# Patient Record
Sex: Female | Born: 1990 | Hispanic: No | Marital: Single | Smoking: Never smoker
Health system: Southern US, Community
[De-identification: ages and names within clinical notes are randomized; demographics above are authoritative.]

## PROBLEM LIST (undated history)

## (undated) ENCOUNTER — Inpatient Hospital Stay (HOSPITAL_COMMUNITY): Payer: Self-pay

## (undated) DIAGNOSIS — Z87440 Personal history of urinary (tract) infections: Secondary | ICD-10-CM

## (undated) DIAGNOSIS — B1910 Unspecified viral hepatitis B without hepatic coma: Secondary | ICD-10-CM

## (undated) DIAGNOSIS — K759 Inflammatory liver disease, unspecified: Secondary | ICD-10-CM

## (undated) HISTORY — PX: NO PAST SURGERIES: SHX2092

## (undated) HISTORY — DX: Unspecified viral hepatitis B without hepatic coma: B19.10

---

## 2013-10-26 ENCOUNTER — Emergency Department (HOSPITAL_COMMUNITY): Payer: No Typology Code available for payment source

## 2013-10-26 ENCOUNTER — Encounter (HOSPITAL_COMMUNITY): Payer: Self-pay | Admitting: Emergency Medicine

## 2013-10-26 ENCOUNTER — Emergency Department (HOSPITAL_COMMUNITY)
Admission: EM | Admit: 2013-10-26 | Discharge: 2013-10-26 | Disposition: A | Discharge status: Home / Self Care | Payer: No Typology Code available for payment source | Attending: Emergency Medicine | Admitting: Emergency Medicine

## 2013-10-26 DIAGNOSIS — R079 Chest pain, unspecified: Secondary | ICD-10-CM | POA: Insufficient documentation

## 2013-10-26 DIAGNOSIS — Y92410 Unspecified street and highway as the place of occurrence of the external cause: Secondary | ICD-10-CM | POA: Insufficient documentation

## 2013-10-26 DIAGNOSIS — R0602 Shortness of breath: Secondary | ICD-10-CM | POA: Insufficient documentation

## 2013-10-26 DIAGNOSIS — M549 Dorsalgia, unspecified: Secondary | ICD-10-CM | POA: Insufficient documentation

## 2013-10-26 DIAGNOSIS — Y939 Activity, unspecified: Secondary | ICD-10-CM | POA: Insufficient documentation

## 2013-10-26 DIAGNOSIS — S161XXA Strain of muscle, fascia and tendon at neck level, initial encounter: Secondary | ICD-10-CM

## 2013-10-26 DIAGNOSIS — S139XXA Sprain of joints and ligaments of unspecified parts of neck, initial encounter: Secondary | ICD-10-CM | POA: Insufficient documentation

## 2013-10-26 DIAGNOSIS — J029 Acute pharyngitis, unspecified: Secondary | ICD-10-CM | POA: Insufficient documentation

## 2013-10-26 MED ORDER — HYDROCODONE-ACETAMINOPHEN 5-325 MG PO TABS: 1.0000 | ORAL_TABLET | Freq: Four times a day (QID) | ORAL | Status: DC | PRN

## 2013-10-26 MED ORDER — IBUPROFEN 800 MG PO TABS: 800.0000 mg | ORAL_TABLET | Freq: Three times a day (TID) | ORAL | Status: DC | PRN

## 2013-10-26 NOTE — ED Notes (Addendum)
Pt speaks broken [place]. Family informed RN patient speaks Clydie BraunKaren.

## 2013-10-26 NOTE — ED Notes (Signed)
Patient transported to X-ray 
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
## 2013-10-26 NOTE — Discharge Instructions (Signed)
Return here as needed.  Your x-rays were negative.  Will be sore over the next 7-10 days, and more sore later today and tomorrow use ice and heat on areas that are sore
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
## 2013-10-26 NOTE — ED Provider Notes (Signed)
Medical screening examination/treatment/procedure(s) were performed by non-physician practitioner and as supervising physician I was immediately available for consultation/collaboration.  EKG Interpretation   None         William Audiel Scheiber, MD 10/26/13 1159 

## 2013-10-26 NOTE — ED Notes (Signed)
Pt returned from X-ray.  

## 2014-06-27 ENCOUNTER — Encounter: Payer: Self-pay | Admitting: Obstetrics

## 2014-06-27 ENCOUNTER — Other Ambulatory Visit: Payer: Self-pay | Admitting: Obstetrics

## 2014-06-27 ENCOUNTER — Ambulatory Visit (INDEPENDENT_AMBULATORY_CARE_PROVIDER_SITE_OTHER): Payer: Medicaid Other | Admitting: Obstetrics

## 2014-06-27 VITALS — BP 109/77 | HR 85 | Temp 97.1°F | Ht 65.0 in | Wt 144.0 lb

## 2014-06-27 DIAGNOSIS — N390 Urinary tract infection, site not specified: Secondary | ICD-10-CM

## 2014-06-27 DIAGNOSIS — Z3401 Encounter for supervision of normal first pregnancy, first trimester: Secondary | ICD-10-CM

## 2014-06-27 DIAGNOSIS — Z3402 Encounter for supervision of normal first pregnancy, second trimester: Secondary | ICD-10-CM

## 2014-06-27 DIAGNOSIS — Z23 Encounter for immunization: Secondary | ICD-10-CM

## 2014-06-27 DIAGNOSIS — Z3400 Encounter for supervision of normal first pregnancy, unspecified trimester: Secondary | ICD-10-CM

## 2014-06-27 DIAGNOSIS — O9989 Other specified diseases and conditions complicating pregnancy, childbirth and the puerperium: Secondary | ICD-10-CM

## 2014-06-27 LAB — POCT URINALYSIS DIPSTICK
Bilirubin, UA: NEGATIVE
Glucose, UA: NEGATIVE
Ketones, UA: NEGATIVE
Nitrite, UA: POSITIVE
PH UA: 7
Spec Grav, UA: 1.01
Urobilinogen, UA: NEGATIVE

## 2014-06-27 LAB — OB RESULTS CONSOLE GC/CHLAMYDIA
CHLAMYDIA, DNA PROBE: NEGATIVE
GC PROBE AMP, GENITAL: NEGATIVE

## 2014-06-27 LAB — TSH: TSH: 2.583 u[IU]/mL (ref 0.350–4.500)

## 2014-06-27 MED ORDER — NITROFURANTOIN MONOHYD MACRO 100 MG PO CAPS: 100.0000 mg | ORAL_CAPSULE | Freq: Two times a day (BID) | ORAL | Status: DC

## 2014-06-27 MED ORDER — INFLUENZA VAC SPLIT QUAD 0.5 ML IM SUSY: 0.5000 mL | PREFILLED_SYRINGE | Freq: Once | INTRAMUSCULAR | Status: DC

## 2014-06-27 MED ORDER — OB COMPLETE PETITE 35-5-1-200 MG PO CAPS: 1.0000 | ORAL_CAPSULE | Freq: Every day | ORAL | Status: DC

## 2014-06-28 ENCOUNTER — Other Ambulatory Visit: Payer: Self-pay | Admitting: Obstetrics

## 2014-06-28 DIAGNOSIS — B9689 Other specified bacterial agents as the cause of diseases classified elsewhere: Secondary | ICD-10-CM

## 2014-06-28 DIAGNOSIS — N760 Acute vaginitis: Principal | ICD-10-CM

## 2014-06-28 LAB — WET PREP BY MOLECULAR PROBE
Candida species: NEGATIVE
GARDNERELLA VAGINALIS: POSITIVE — AB
Trichomonas vaginosis: NEGATIVE

## 2014-06-28 LAB — OBSTETRIC PANEL
ANTIBODY SCREEN: NEGATIVE
BASOS ABS: 0 10*3/uL (ref 0.0–0.1)
BASOS PCT: 0 % (ref 0–1)
EOS ABS: 0.1 10*3/uL (ref 0.0–0.7)
EOS PCT: 1 % (ref 0–5)
HEMATOCRIT: 34.1 % — AB (ref 36.0–46.0)
Hemoglobin: 11.4 g/dL — ABNORMAL LOW (ref 12.0–15.0)
Hepatitis B Surface Ag: POSITIVE — AB
Lymphocytes Relative: 22 % (ref 12–46)
Lymphs Abs: 1.7 10*3/uL (ref 0.7–4.0)
MCH: 29.6 pg (ref 26.0–34.0)
MCHC: 33.4 g/dL (ref 30.0–36.0)
MCV: 88.6 fL (ref 78.0–100.0)
MONO ABS: 0.5 10*3/uL (ref 0.1–1.0)
Monocytes Relative: 6 % (ref 3–12)
Neutro Abs: 5.5 10*3/uL (ref 1.7–7.7)
Neutrophils Relative %: 71 % (ref 43–77)
Platelets: 334 10*3/uL (ref 150–400)
RBC: 3.85 MIL/uL — ABNORMAL LOW (ref 3.87–5.11)
RDW: 14.9 % (ref 11.5–15.5)
Rh Type: POSITIVE
Rubella: 8.74 Index — ABNORMAL HIGH (ref <0.90)
WBC: 7.8 10*3/uL (ref 4.0–10.5)

## 2014-06-28 LAB — PAP IG W/ RFLX HPV ASCU

## 2014-06-28 LAB — VITAMIN D 25 HYDROXY (VIT D DEFICIENCY, FRACTURES): Vit D, 25-Hydroxy: 38 ng/mL (ref 30–89)

## 2014-06-28 LAB — GC/CHLAMYDIA PROBE AMP
CT PROBE, AMP APTIMA: NEGATIVE
GC Probe RNA: NEGATIVE

## 2014-06-28 LAB — VARICELLA ZOSTER ANTIBODY, IGG: Varicella IgG: 2218 Index — ABNORMAL HIGH (ref <135.00)

## 2014-06-28 LAB — HIV ANTIBODY (ROUTINE TESTING W REFLEX): HIV 1&2 Ab, 4th Generation: NONREACTIVE

## 2014-06-28 LAB — HEPATITIS B SURF AG CONFIRMATION: HEPATITIS B SURFACE ANTIGEN CONFIRMATION: POSITIVE — AB

## 2014-06-28 MED ORDER — METRONIDAZOLE 500 MG PO TABS: 500.0000 mg | ORAL_TABLET | Freq: Two times a day (BID) | ORAL | Status: DC

## 2014-06-29 ENCOUNTER — Encounter: Payer: Self-pay | Admitting: Obstetrics

## 2014-06-29 DIAGNOSIS — N390 Urinary tract infection, site not specified: Secondary | ICD-10-CM | POA: Insufficient documentation

## 2014-06-29 NOTE — Progress Notes (Signed)
Subjective:    Joan Sawyer is a 23 y.o. female being seen today for her obstetrical visit. She is at Unknown gestation. Patient reports: no complaints . Fetal movement: normal.  Problem List Items Addressed This Visit   None    Visit Diagnoses   Encounter for supervision of normal first pregnancy in second trimester    -  Primary    Relevant Medications       Prenat-FeCbn-FeAspGl-FA-Omega (OB COMPLETE PETITE) 35-5-1-200 MG CAPS    Encounter for supervision of normal first pregnancy in first trimester        Relevant Orders       POCT urinalysis dipstick (Completed)       POCT urinalysis dipstick       Obstetric panel       HIV antibody       Hemoglobinopathy evaluation       Varicella zoster antibody, IgG       Vit D  25 hydroxy (rtn osteoporosis monitoring)       Culture, OB Urine       TSH       WET PREP BY MOLECULAR PROBE (Completed)       GC/Chlamydia Probe Amp       Pap IG w/ reflex to HPV when ASC-U (Completed)    Urinary tract infection, site not specified        Relevant Medications       Nitrofurantoin monohyd macro (MACROBID) 100 mg po cap       Influenza vac split quadrivalent PF (FLUARIX) injection 0.5 mL    Other specified complication, antepartum(646.83)        Relevant Orders       US OB Comp + 14 Wk      There are no active problems to display for this patient.  Objective:    BP 109/77  Pulse 85  Temp(Src) 97.1 F (36.2 C)  Ht  (1.651 m)  Wt 144 lb (65.318 kg)  BMI 23.96 kg/m2  LMP 01/08/2014 FHT: 150 BPM  Uterine Size: size equals dates     Assessment:    Pregnancy @ Unknown    Plan:    OBGCT: ordered.  Labs, problem list reviewed and updated 2 hr GTT planned Follow up in 2 weeks.

## 2014-06-30 ENCOUNTER — Other Ambulatory Visit: Payer: Self-pay | Admitting: Obstetrics

## 2014-06-30 DIAGNOSIS — R768 Other specified abnormal immunological findings in serum: Secondary | ICD-10-CM

## 2014-06-30 LAB — CULTURE, OB URINE: Colony Count: >=100000

## 2014-06-30 LAB — HEMOGLOBINOPATHY EVALUATION
HGB A2 QUANT: 3 % (ref 2.2–3.2)
HGB F QUANT: 0.4 % (ref 0.0–2.0)
Hemoglobin Other: 0 %
Hgb A: 96.6 % — ABNORMAL LOW (ref 96.8–97.8)
Hgb S Quant: 0 %

## 2014-07-01 ENCOUNTER — Ambulatory Visit (HOSPITAL_COMMUNITY)
Admission: RE | Admit: 2014-07-01 | Discharge: 2014-07-01 | Disposition: A | Discharge status: Home / Self Care | Payer: Medicaid Other | Source: Ambulatory Visit | Attending: Obstetrics | Admitting: Obstetrics

## 2014-07-01 DIAGNOSIS — O9989 Other specified diseases and conditions complicating pregnancy, childbirth and the puerperium: Secondary | ICD-10-CM

## 2014-07-01 DIAGNOSIS — O0930 Supervision of pregnancy with insufficient antenatal care, unspecified trimester: Secondary | ICD-10-CM | POA: Insufficient documentation

## 2014-07-01 DIAGNOSIS — Z3689 Encounter for other specified antenatal screening: Secondary | ICD-10-CM

## 2014-07-01 DIAGNOSIS — O0932 Supervision of pregnancy with insufficient antenatal care, second trimester: Secondary | ICD-10-CM

## 2014-07-01 NOTE — Progress Notes (Signed)
Patient does not speak English, so appointment to review lab with interpeter has been scheduled.

## 2014-07-02 ENCOUNTER — Other Ambulatory Visit: Payer: Self-pay | Admitting: Obstetrics

## 2014-07-04 ENCOUNTER — Encounter: Payer: Medicaid Other | Admitting: Obstetrics

## 2014-07-05 ENCOUNTER — Ambulatory Visit (INDEPENDENT_AMBULATORY_CARE_PROVIDER_SITE_OTHER): Payer: Medicaid Other | Admitting: Obstetrics

## 2014-07-05 VITALS — BP 115/78 | HR 82 | Wt 148.0 lb

## 2014-07-05 DIAGNOSIS — R768 Other specified abnormal immunological findings in serum: Secondary | ICD-10-CM | POA: Insufficient documentation

## 2014-07-05 DIAGNOSIS — R894 Abnormal immunological findings in specimens from other organs, systems and tissues: Secondary | ICD-10-CM

## 2014-07-05 DIAGNOSIS — Z3402 Encounter for supervision of normal first pregnancy, second trimester: Secondary | ICD-10-CM

## 2014-07-05 DIAGNOSIS — Z3401 Encounter for supervision of normal first pregnancy, first trimester: Secondary | ICD-10-CM

## 2014-07-05 DIAGNOSIS — Z3400 Encounter for supervision of normal first pregnancy, unspecified trimester: Secondary | ICD-10-CM

## 2014-07-05 NOTE — Progress Notes (Signed)
Subjective:    Joan Sawyer is a 23 y.o. female being seen today for her obstetrical visit. She is at [redacted] weeks gestation. Patient reports: no complaints . Fetal movement: normal.  Problem List Items Addressed This Visit   None    Visit Diagnoses   Encounter for supervision of normal first pregnancy in first trimester    -  Primary    Relevant Orders       POCT urinalysis dipstick      Patient Active Problem List   Diagnosis Date Noted  . Urinary tract infection, site not specified 06/29/2014   Objective:    BP 115/78  Pulse 82  Wt 148 lb (67.132 kg)  LMP 01/08/2014 FHT: 150 BPM  Uterine Size: size equals dates     Assessment:    Pregnancy @ [redacted] weeks gestation.  Positive Hepatitis B screen   Plan:   Hepatitis panel drawn today. Will need referral to GI.    Labs, problem list reviewed and updated 2 hr GTT planned next visit Follow up in 2 weeks.

## 2014-07-06 ENCOUNTER — Encounter: Payer: Self-pay | Admitting: Obstetrics

## 2014-07-06 ENCOUNTER — Encounter: Payer: Medicaid Other | Admitting: Obstetrics

## 2014-07-06 DIAGNOSIS — Z3402 Encounter for supervision of normal first pregnancy, second trimester: Secondary | ICD-10-CM | POA: Insufficient documentation

## 2014-07-06 LAB — COMPREHENSIVE METABOLIC PANEL
ALT: 58 U/L — AB (ref 0–35)
AST: 38 U/L — AB (ref 0–37)
Albumin: 3.5 g/dL (ref 3.5–5.2)
Alkaline Phosphatase: 67 U/L (ref 39–117)
BILIRUBIN TOTAL: 0.3 mg/dL (ref 0.2–1.2)
BUN: 6 mg/dL (ref 6–23)
CO2: 24 mEq/L (ref 19–32)
Calcium: 8.7 mg/dL (ref 8.4–10.5)
Chloride: 105 mEq/L (ref 96–112)
Creat: 0.5 mg/dL (ref 0.50–1.10)
Glucose, Bld: 61 mg/dL — ABNORMAL LOW (ref 70–99)
Potassium: 4 mEq/L (ref 3.5–5.3)
SODIUM: 136 meq/L (ref 135–145)
TOTAL PROTEIN: 6.5 g/dL (ref 6.0–8.3)

## 2014-07-06 LAB — HEPATITIS PANEL, ACUTE
HCV Ab: NEGATIVE
HEP A IGM: NONREACTIVE
HEP B S AG: POSITIVE — AB
Hep B C IgM: NONREACTIVE

## 2014-07-06 LAB — HEPATITIS B SURF AG CONFIRMATION

## 2014-07-06 LAB — HEPATITIS B SURFACE ANTIBODY,QUALITATIVE: HEP B S AB: POSITIVE — AB

## 2014-07-06 LAB — HEPATITIS C ANTIBODY: HCV AB: NEGATIVE

## 2014-07-06 LAB — HEPATITIS A ANTIBODY, IGM: HEP A IGM: NONREACTIVE

## 2014-07-06 LAB — HEPATITIS B CORE ANTIBODY, IGM: HEP B C IGM: NONREACTIVE

## 2014-07-06 LAB — HEPATITIS B CORE ANTIBODY, TOTAL: HEP B C TOTAL AB: REACTIVE — AB

## 2014-07-06 NOTE — Progress Notes (Signed)
Labs reviewed.

## 2014-07-07 LAB — HEPATITIS B DNA, ULTRAQUANTITATIVE, PCR
Hepatitis B DNA (Calc): 473672707 copies/mL — ABNORMAL HIGH (ref <116)
Hepatitis B DNA: 81387063 IU/mL — ABNORMAL HIGH (ref <20)

## 2014-07-08 ENCOUNTER — Other Ambulatory Visit: Payer: Self-pay | Admitting: Obstetrics

## 2014-07-08 DIAGNOSIS — B1910 Unspecified viral hepatitis B without hepatic coma: Secondary | ICD-10-CM

## 2014-07-08 DIAGNOSIS — O98412 Viral hepatitis complicating pregnancy, second trimester: Principal | ICD-10-CM

## 2014-07-11 ENCOUNTER — Other Ambulatory Visit: Payer: Medicaid Other

## 2014-07-11 ENCOUNTER — Ambulatory Visit (INDEPENDENT_AMBULATORY_CARE_PROVIDER_SITE_OTHER): Payer: Medicaid Other | Admitting: Obstetrics

## 2014-07-11 ENCOUNTER — Encounter: Payer: Self-pay | Admitting: Obstetrics

## 2014-07-11 VITALS — BP 123/82 | HR 92 | Temp 97.1°F | Wt 149.0 lb

## 2014-07-11 DIAGNOSIS — Z3403 Encounter for supervision of normal first pregnancy, third trimester: Secondary | ICD-10-CM

## 2014-07-11 DIAGNOSIS — Z3400 Encounter for supervision of normal first pregnancy, unspecified trimester: Secondary | ICD-10-CM

## 2014-07-11 DIAGNOSIS — R768 Other specified abnormal immunological findings in serum: Secondary | ICD-10-CM

## 2014-07-11 DIAGNOSIS — R894 Abnormal immunological findings in specimens from other organs, systems and tissues: Secondary | ICD-10-CM

## 2014-07-11 DIAGNOSIS — Z3402 Encounter for supervision of normal first pregnancy, second trimester: Secondary | ICD-10-CM

## 2014-07-11 LAB — POCT URINALYSIS DIPSTICK
BILIRUBIN UA: NEGATIVE
GLUCOSE UA: NEGATIVE
KETONES UA: NEGATIVE
Leukocytes, UA: NEGATIVE
Nitrite, UA: NEGATIVE
PROTEIN UA: NEGATIVE
RBC UA: NEGATIVE
SPEC GRAV UA: 1.01
Urobilinogen, UA: NEGATIVE
pH, UA: 6

## 2014-07-11 LAB — CBC
HEMATOCRIT: 34.9 % — AB (ref 36.0–46.0)
HEMOGLOBIN: 11.4 g/dL — AB (ref 12.0–15.0)
MCH: 29.8 pg (ref 26.0–34.0)
MCHC: 32.7 g/dL (ref 30.0–36.0)
MCV: 91.4 fL (ref 78.0–100.0)
Platelets: 306 10*3/uL (ref 150–400)
RBC: 3.82 MIL/uL — AB (ref 3.87–5.11)
RDW: 14.4 % (ref 11.5–15.5)
WBC: 7.3 10*3/uL (ref 4.0–10.5)

## 2014-07-11 NOTE — Progress Notes (Signed)
Subjective:    Joan Sawyer is a 23 y.o. female being seen today for her obstetrical visit. She is at [redacted]w[redacted]d gestation. Patient reports: no complaints . Fetal movement: normal.  Problem List Items Addressed This Visit   Encounter for supervision of normal first pregnancy in second trimester - Primary   Relevant Orders      Glucose Tolerance, 2 Hours w/1 Hour      CBC      HIV antibody      RPR      POCT urinalysis dipstick     Patient Active Problem List   Diagnosis Date Noted  . Encounter for supervision of normal first pregnancy in second trimester 07/06/2014  . 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 Urinary tract infection, site not specified 06/29/2014   Objective:    BP 123/82  Pulse 92  Temp(Src) 97.1 F (36.2 C)  Wt 149 lb (67.586 kg)  LMP 01/08/2014 FHT: 150 BPM  Uterine Size: size equals dates     Assessment:    Pregnancy @ [redacted]w[redacted]d    Plan:    OBGCT: ordered.  Labs, problem list reviewed and updated 2 hr GTT planned Follow up in 2 weeks.

## 2014-07-12 LAB — GLUCOSE TOLERANCE, 2 HOURS W/ 1HR
GLUCOSE, 2 HOUR: 69 mg/dL — AB (ref 70–139)
Glucose, 1 hour: 49 mg/dL — ABNORMAL LOW (ref 70–170)
Glucose, Fasting: 66 mg/dL — ABNORMAL LOW (ref 70–99)

## 2014-07-12 LAB — HIV ANTIBODY (ROUTINE TESTING W REFLEX): HIV 1&2 Ab, 4th Generation: NONREACTIVE

## 2014-07-12 LAB — RPR

## 2014-07-25 ENCOUNTER — Encounter: Payer: Self-pay | Admitting: Obstetrics

## 2014-07-25 ENCOUNTER — Ambulatory Visit (INDEPENDENT_AMBULATORY_CARE_PROVIDER_SITE_OTHER): Payer: Medicaid Other | Admitting: Obstetrics

## 2014-07-25 VITALS — BP 113/73 | HR 96 | Temp 98.4°F | Wt 151.0 lb

## 2014-07-25 DIAGNOSIS — Z3402 Encounter for supervision of normal first pregnancy, second trimester: Secondary | ICD-10-CM

## 2014-07-25 DIAGNOSIS — Z3403 Encounter for supervision of normal first pregnancy, third trimester: Secondary | ICD-10-CM

## 2014-07-25 LAB — POCT URINALYSIS DIPSTICK
Bilirubin, UA: NEGATIVE
Blood, UA: NEGATIVE
Glucose, UA: NEGATIVE
Ketones, UA: NEGATIVE
Leukocytes, UA: NEGATIVE
Nitrite, UA: NEGATIVE
PROTEIN UA: NEGATIVE
SPEC GRAV UA: 1.015
Urobilinogen, UA: NEGATIVE
pH, UA: 8

## 2014-07-25 MED ORDER — OB COMPLETE PETITE 35-5-1-200 MG PO CAPS: 1.0000 | ORAL_CAPSULE | Freq: Every day | ORAL | Status: DC

## 2014-07-25 NOTE — Progress Notes (Signed)
Subjective:    Joan Sawyer is a 23 y.o. female being seen today for her obstetrical visit. She is at 3821w2d gestation. Patient reports no complaints. Fetal movement: normal.  Problem List Items Addressed This Visit   Encounter for supervision of normal first pregnancy in second trimester - Primary   Relevant Medications      Prenat-FeCbn-FeAspGl-FA-Omega (OB COMPLETE PETITE) 35-5-1-200 MG CAPS   Other Relevant Orders      POCT urinalysis dipstick (Completed)     Patient Active Problem List   Diagnosis Date Noted  . Encounter for supervision of normal first pregnancy in second trimester 07/06/2014  . 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 Urinary tract infection, site not specified 06/29/2014   Objective:    BP 113/73  Pulse 96  Temp(Src) 98.4 F (36.9 C)  Wt 151 lb (68.493 kg)  LMP 01/08/2014 FHT:  150 BPM  Uterine Size: size less than dates  Presentation: unsure     Assessment:    Pregnancy @ 5921w2d weeks   Plan:     labs reviewed, problem list updated Consent signed. GBS sent TDAP offered  Rhogam given for RH negative Pediatrician: discussed. Infant feeding: plans to breastfeed. Maternity leave: not discussed. Cigarette smoking: never smoked. Orders Placed This Encounter  Procedures  . POCT urinalysis dipstick   Meds ordered this encounter  Medications  . Prenat-FeCbn-FeAspGl-FA-Omega (OB COMPLETE PETITE) 35-5-1-200 MG CAPS    Sig: Take 1 capsule by mouth daily.    Dispense:  30 capsule    Refill:  11   Follow up in 1 Week.

## 2014-07-26 ENCOUNTER — Telehealth: Payer: Self-pay

## 2014-07-26 NOTE — Telephone Encounter (Signed)
was told patient did not show for her original appt on 07/20/14 with gasterologist - doctor will get back to us when she is rescheduled. Patient was called twice to confirm appt.

## 2014-07-27 ENCOUNTER — Telehealth: Payer: Self-pay

## 2014-07-27 NOTE — Telephone Encounter (Signed)
LEFT 2 MESSAGES REGARDING APPOINTMENT WITH DR PATRICK HUNG AT [facility] - THIS IS 2ND APPT FOR HER - IT IS 10/19 AT 11AM - TOLD HER TO CALL [facility]B SO COULD LET HER KNOW ABOUT APPT.

## 2014-07-29 ENCOUNTER — Telehealth: Payer: Self-pay

## 2014-07-29 NOTE — Telephone Encounter (Signed)
Finally reached patient today - gave her address and phone number of [facility] - she also needs to bring someone with her who speaks [place], as they don't have interpreters - appt is 10/19 at 11am - she missed first appt, so must go this time.

## 2014-08-08 ENCOUNTER — Encounter: Payer: Self-pay | Admitting: Obstetrics

## 2014-08-08 ENCOUNTER — Ambulatory Visit (INDEPENDENT_AMBULATORY_CARE_PROVIDER_SITE_OTHER): Payer: Medicaid Other | Admitting: Obstetrics

## 2014-08-08 VITALS — BP 114/73 | HR 94 | Wt 154.0 lb

## 2014-08-08 DIAGNOSIS — Z3403 Encounter for supervision of normal first pregnancy, third trimester: Secondary | ICD-10-CM

## 2014-08-08 LAB — POCT URINALYSIS DIPSTICK
Bilirubin, UA: NEGATIVE
Blood, UA: NEGATIVE
Glucose, UA: NEGATIVE
Ketones, UA: NEGATIVE
Leukocytes, UA: NEGATIVE
Nitrite, UA: NEGATIVE
PH UA: 7
Protein, UA: NEGATIVE
Spec Grav, UA: <=1.005
Urobilinogen, UA: NEGATIVE

## 2014-08-08 NOTE — Progress Notes (Signed)
Subjective:    Joan Sawyer is a 23 y.o. female being seen today for her obstetrical visit. She is at 4423w2d gestation. Patient reports no complaints. Fetal movement: normal.  Problem List Items Addressed This Visit   None    Visit Diagnoses   Encounter for supervision of normal first pregnancy in third trimester    -  Primary    Relevant Orders       POCT urinalysis dipstick (Completed)      Patient Active Problem List   Diagnosis Date Noted  . Encounter for supervision of normal first pregnancy in second trimester 07/06/2014  . 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 Urinary tract infection, site not specified 06/29/2014   Objective:    BP 114/73  Pulse 94  Wt 154 lb (69.854 kg)  LMP 01/08/2014 FHT:  150 BPM  Uterine Size: size equals dates  Presentation: unsure     Assessment:    Pregnancy @ 7023w2d weeks   Plan:     labs reviewed, problem list updated Consent signed. GBS sent TDAP offered  Rhogam given for RH negative Pediatrician: discussed. Infant feeding: plans to breastfeed. Maternity leave: not discussed. Cigarette smoking: never smoked. Orders Placed This Encounter  Procedures  . POCT urinalysis dipstick   No orders of the defined types were placed in this encounter.   Follow up in 2 Weeks.

## 2014-08-15 ENCOUNTER — Encounter: Payer: Self-pay | Admitting: Obstetrics

## 2014-08-22 ENCOUNTER — Ambulatory Visit (INDEPENDENT_AMBULATORY_CARE_PROVIDER_SITE_OTHER): Payer: Medicaid Other | Admitting: Obstetrics

## 2014-08-22 ENCOUNTER — Encounter: Payer: Self-pay | Admitting: Obstetrics

## 2014-08-22 VITALS — BP 109/75 | HR 111 | Temp 98.0°F | Wt 158.0 lb

## 2014-08-22 DIAGNOSIS — Z3403 Encounter for supervision of normal first pregnancy, third trimester: Secondary | ICD-10-CM

## 2014-08-22 LAB — POCT URINALYSIS DIPSTICK
Bilirubin, UA: NEGATIVE
Blood, UA: NEGATIVE
GLUCOSE UA: NEGATIVE
Ketones, UA: NEGATIVE
LEUKOCYTES UA: NEGATIVE
NITRITE UA: NEGATIVE
Protein, UA: NEGATIVE
Spec Grav, UA: 1.015
UROBILINOGEN UA: NEGATIVE
pH, UA: 7

## 2014-08-22 NOTE — Progress Notes (Signed)
Subjective:    Joan Sawyer is a 23 y.o. female being seen today for her obstetrical visit. She is at 5458w2d gestation. Patient reports no complaints. Fetal movement: normal.  Problem List Items Addressed This Visit    None    Visit Diagnoses    Encounter for supervision of normal first pregnancy in third trimester    -  Primary    Relevant Orders       POCT urinalysis dipstick (Completed)      Patient Active Problem List   Diagnosis Date Noted  . Encounter for supervision of normal first pregnancy in second trimester 07/06/2014  . 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 Urinary tract infection, site not specified 06/29/2014   Objective:    BP 109/75 mmHg  Pulse 111  Temp(Src) 98 F (36.7 C)  Wt 158 lb (71.668 kg)  LMP 01/08/2014 FHT:  150 BPM  Uterine Size: size equals dates  Presentation: unsure     Assessment:    Pregnancy @ 1258w2d weeks   Plan:     labs reviewed, problem list updated Consent signed. GBS sent TDAP offered  Rhogam given for RH negative Pediatrician: discussed. Infant feeding: plans to breastfeed. Maternity leave: not discussed. Cigarette smoking: never smoked. Orders Placed This Encounter  Procedures  . POCT urinalysis dipstick   No orders of the defined types were placed in this encounter.   Follow up in 2 Weeks.

## 2014-09-05 ENCOUNTER — Encounter: Payer: Self-pay | Admitting: Obstetrics

## 2014-09-05 ENCOUNTER — Ambulatory Visit (INDEPENDENT_AMBULATORY_CARE_PROVIDER_SITE_OTHER): Payer: Medicaid Other | Admitting: Obstetrics

## 2014-09-05 VITALS — BP 113/79 | HR 89 | Temp 97.8°F | Wt 160.0 lb

## 2014-09-05 DIAGNOSIS — Z3403 Encounter for supervision of normal first pregnancy, third trimester: Secondary | ICD-10-CM

## 2014-09-05 LAB — POCT URINALYSIS DIPSTICK
Bilirubin, UA: NEGATIVE
Blood, UA: NEGATIVE
Glucose, UA: NEGATIVE
KETONES UA: NEGATIVE
NITRITE UA: NEGATIVE
PH UA: 7
PROTEIN UA: NEGATIVE
Spec Grav, UA: <=1.005
UROBILINOGEN UA: NEGATIVE

## 2014-09-05 NOTE — Progress Notes (Signed)
Subjective:    Joan Sawyer is a 23 y.o. female being seen today for her obstetrical visit. She is at 2842w2d gestation. Patient reports no complaints. Fetal movement: normal.  Problem List Items Addressed This Visit    None    Visit Diagnoses    Encounter for supervision of normal first pregnancy in third trimester    -  Primary    Relevant Orders       POCT urinalysis dipstick      Patient Active Problem List   Diagnosis Date Noted  . Encounter for supervision of normal first pregnancy in second trimester 07/06/2014  . 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 Urinary tract infection, site not specified 06/29/2014   Objective:    BP 113/79 mmHg  Pulse 89  Temp(Src) 97.8 F (36.6 C)  Wt 160 lb (72.576 kg)  LMP 01/08/2014 FHT:  150 BPM  Uterine Size: size less than dates  Presentation: unsure     Assessment:    Pregnancy @ 6642w2d weeks   Plan:     labs reviewed, problem list updated Consent signed. GBS sent TDAP offered  Rhogam given for RH negative Pediatrician: discussed. Infant feeding: plans to breastfeed. Maternity leave: not discussed. Cigarette smoking: never smoked. Orders Placed This Encounter  Procedures  . POCT urinalysis dipstick   No orders of the defined types were placed in this encounter.   Follow up in 2 Weeks.

## 2014-09-19 ENCOUNTER — Encounter (HOSPITAL_COMMUNITY): Payer: Self-pay | Admitting: *Deleted

## 2014-09-19 ENCOUNTER — Inpatient Hospital Stay (HOSPITAL_COMMUNITY)
Admission: AD | Admit: 2014-09-19 | Discharge: 2014-09-19 | Disposition: A | Discharge status: Home / Self Care | Payer: Medicaid Other | Source: Ambulatory Visit | Attending: Obstetrics & Gynecology | Admitting: Obstetrics & Gynecology

## 2014-09-19 ENCOUNTER — Ambulatory Visit (INDEPENDENT_AMBULATORY_CARE_PROVIDER_SITE_OTHER): Payer: Medicaid Other | Admitting: Obstetrics

## 2014-09-19 DIAGNOSIS — Z3403 Encounter for supervision of normal first pregnancy, third trimester: Secondary | ICD-10-CM

## 2014-09-19 DIAGNOSIS — Z3A35 35 weeks gestation of pregnancy: Secondary | ICD-10-CM | POA: Insufficient documentation

## 2014-09-19 HISTORY — DX: Inflammatory liver disease, unspecified: K75.9

## 2014-09-19 LAB — POCT URINALYSIS DIPSTICK
BILIRUBIN UA: NEGATIVE
Glucose, UA: NEGATIVE
Ketones, UA: NEGATIVE
Nitrite, UA: NEGATIVE
PROTEIN UA: NEGATIVE
RBC UA: NEGATIVE
Spec Grav, UA: <=1.005
Urobilinogen, UA: NEGATIVE
pH, UA: 8

## 2014-09-19 LAB — POCT FERN TEST: POCT Fern Test: NEGATIVE

## 2014-09-19 NOTE — MAU Note (Signed)
Pt reports contractions since 6 pm. ? Leaking fluid since time unknown.

## 2014-09-19 NOTE — Discharge Instructions (Signed)

## 2014-09-20 ENCOUNTER — Encounter: Payer: Self-pay | Admitting: Obstetrics

## 2014-09-20 LAB — STREP B DNA PROBE: GBSP: NOT DETECTED

## 2014-09-20 NOTE — Progress Notes (Signed)
Subjective:    Helen HashimotoChan Moccio is a 23 y.o. female being seen today for her obstetrical visit. She is at 6288w3d gestation. Patient reports no complaints. Fetal movement: normal.  Problem List Items Addressed This Visit    None    Visit Diagnoses    Encounter for supervision of normal first pregnancy in third trimester    -  Primary    Relevant Orders       POCT urinalysis dipstick (Completed)       Strep B DNA probe      Patient Active Problem List   Diagnosis Date Noted  . Encounter for supervision of normal first pregnancy in second trimester 07/06/2014  . 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 Urinary tract infection, site not specified 06/29/2014   Objective:    LMP 01/08/2014 FHT:  150 BPM  Uterine Size: size equals dates  Presentation: unsure     Assessment:    Pregnancy @ 9788w3d weeks   Plan:     labs reviewed, problem list updated Consent signed. GBS sent TDAP offered  Rhogam given for RH negative Pediatrician: discussed. Infant feeding: plans to breastfeed. Maternity leave: not discussed. Cigarette smoking: never smoked. Orders Placed This Encounter  Procedures  . Strep B DNA probe  . POCT urinalysis dipstick   No orders of the defined types were placed in this encounter.   Follow up in 1 Week.

## 2014-09-26 ENCOUNTER — Ambulatory Visit (INDEPENDENT_AMBULATORY_CARE_PROVIDER_SITE_OTHER): Payer: Medicaid Other | Admitting: Obstetrics

## 2014-09-26 VITALS — BP 123/78 | HR 119 | Temp 98.2°F | Wt 165.0 lb

## 2014-09-26 DIAGNOSIS — Z3403 Encounter for supervision of normal first pregnancy, third trimester: Secondary | ICD-10-CM

## 2014-09-26 LAB — POCT URINALYSIS DIPSTICK
Bilirubin, UA: NEGATIVE
Blood, UA: NEGATIVE
Glucose, UA: NEGATIVE
KETONES UA: NEGATIVE
NITRITE UA: NEGATIVE
PROTEIN UA: NEGATIVE
Spec Grav, UA: 1.01
Urobilinogen, UA: NEGATIVE
pH, UA: 7

## 2014-09-27 ENCOUNTER — Encounter: Payer: Self-pay | Admitting: Obstetrics

## 2014-09-27 NOTE — Progress Notes (Signed)
Subjective:    Joan Sawyer is a 23 y.o. female being seen today for her obstetrical visit. She is at 1533w3d gestation. Patient reports no complaints. Fetal movement: normal.  Problem List Items Addressed This Visit    None    Visit Diagnoses    Encounter for supervision of normal first pregnancy in third trimester    -  Primary      Patient Active Problem List   Diagnosis Date Noted  . Encounter for supervision of normal first pregnancy in second trimester 07/06/2014  . 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 Urinary tract infection, site not specified 06/29/2014    Objective:    BP 123/78 mmHg  Pulse 119  Temp(Src) 98.2 F (36.8 C)  Wt 165 lb (74.844 kg)  LMP 01/08/2014 FHT: 150 BPM  Uterine Size: size equals dates  Presentations: unsure  Pelvic Exam: Deferred    Assessment:    Pregnancy @ 1533w3d weeks   Plan:   Plans for delivery: Vaginal anticipated; labs reviewed; problem list updated Counseling: Consent signed. Infant feeding: plans to breastfeed. Cigarette smoking: never smoked. L&D discussion: symptoms of labor, discussed when to call, discussed what number to call, anesthetic/analgesic options reviewed and delivering clinician:  plans Physician. Postpartum supports and preparation: circumcision discussed and contraception plans discussed.  Follow up in 1 Week.

## 2014-10-03 ENCOUNTER — Encounter: Payer: Self-pay | Admitting: Obstetrics

## 2014-10-03 ENCOUNTER — Ambulatory Visit (INDEPENDENT_AMBULATORY_CARE_PROVIDER_SITE_OTHER): Payer: Medicaid Other | Admitting: Obstetrics

## 2014-10-03 VITALS — BP 118/84 | HR 99 | Temp 97.5°F | Wt 166.0 lb

## 2014-10-03 DIAGNOSIS — Z3483 Encounter for supervision of other normal pregnancy, third trimester: Secondary | ICD-10-CM

## 2014-10-03 LAB — POCT URINALYSIS DIPSTICK
Blood, UA: NEGATIVE
Glucose, UA: NEGATIVE
Ketones, UA: NEGATIVE
Nitrite, UA: NEGATIVE
Protein, UA: NEGATIVE
SPEC GRAV UA: 1.01
pH, UA: 7

## 2014-10-03 NOTE — Progress Notes (Signed)
Subjective:    Helen HashimotoChan Pulis is a 23 y.o. female being seen today for her obstetrical visit. She is at 5266w2d gestation. Patient reports no complaints. Fetal movement: decreased.  Problem List Items Addressed This Visit    None    Visit Diagnoses    Encounter for supervision of other normal pregnancy in third trimester    -  Primary    Relevant Orders       POCT urinalysis dipstick (Completed)      Patient Active Problem List   Diagnosis Date Noted  . Encounter for supervision of normal first pregnancy in second trimester 07/06/2014  . 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 Urinary tract infection, site not specified 06/29/2014    Objective:    BP 118/84 mmHg  Pulse 99  Temp(Src) 97.5 F (36.4 C)  Wt 166 lb (75.297 kg)  LMP 01/08/2014 FHT: 150 BPM  Uterine Size: size equals dates  Presentations: cephalic  Pelvic Exam: Deferred    Assessment:    Pregnancy @ 7766w2d weeks   Plan:   Plans for delivery: Vaginal anticipated; labs reviewed; problem list updated Counseling: Consent signed. Infant feeding: plans to breastfeed. Cigarette smoking: never smoked. L&D discussion: symptoms of labor, discussed when to call, discussed what number to call, anesthetic/analgesic options reviewed and delivering clinician:  plans Physician. Postpartum supports and preparation: circumcision discussed and contraception plans discussed.  Follow up in 1 Week.

## 2014-10-11 ENCOUNTER — Ambulatory Visit (INDEPENDENT_AMBULATORY_CARE_PROVIDER_SITE_OTHER): Payer: Medicaid Other | Admitting: Obstetrics

## 2014-10-11 VITALS — BP 122/79 | HR 109 | Temp 97.9°F | Wt 169.0 lb

## 2014-10-11 DIAGNOSIS — Z3403 Encounter for supervision of normal first pregnancy, third trimester: Secondary | ICD-10-CM

## 2014-10-12 ENCOUNTER — Encounter: Payer: Self-pay | Admitting: Obstetrics

## 2014-10-12 NOTE — Progress Notes (Signed)
Subjective:    Helen HashimotoChan Maler is a 23 y.o. female being seen today for her obstetrical visit. She is at 4194w4d gestation. Patient reports occasional contractions. Fetal movement: normal.  Problem List Items Addressed This Visit    None    Visit Diagnoses    Encounter for supervision of normal first pregnancy in third trimester    -  Primary    Relevant Orders       POCT urinalysis dipstick      Patient Active Problem List   Diagnosis Date Noted  . Encounter for supervision of normal first pregnancy in second trimester 07/06/2014  . 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 Urinary tract infection, site not specified 06/29/2014    Objective:    BP 122/79 mmHg  Pulse 109  Temp(Src) 97.9 F (36.6 C)  Wt 169 lb (76.658 kg)  LMP 01/08/2014 FHT: 150 BPM  Uterine Size: size equals dates  Presentations: cephalic  Pelvic Exam:              Dilation: 2cm       Effacement: 75%             Station:  -2    Consistency: soft            Position: middle     Assessment:    Pregnancy @ 4694w4d weeks   Plan:   Plans for delivery: Vaginal anticipated; labs reviewed; problem list updated Counseling: Consent signed. Infant feeding: plans to breastfeed. Cigarette smoking: never smoked. L&D discussion: symptoms of labor, discussed when to call, discussed what number to call, anesthetic/analgesic options reviewed and delivering clinician:  plans Physician. Postpartum supports and preparation: circumcision discussed and contraception plans discussed.  Follow up in 1 Week.

## 2014-10-14 NOTE — L&D Delivery Note (Signed)
Delivery Note At 9:15 PM a viable female was delivered via Vaginal, Spontaneous Delivery (Presentation: ;  ).  APGAR: 9, 9; weight  .   Placenta status: Intact, spontaneous.  Cord: 3v with the following complications: none .  Cord pH: not obtained  Anesthesia:  None Episiotomy:  None Lacerations:  2nd Suture Repair: 3.0 vicryl Est. Blood Loss (mL):  300  Mom to postpartum.  Baby to Couplet care / Skin to Skin.  [name] 10/17/2014, 9:52 PM

## 2014-10-17 ENCOUNTER — Encounter (HOSPITAL_COMMUNITY): Payer: Self-pay | Admitting: *Deleted

## 2014-10-17 ENCOUNTER — Inpatient Hospital Stay (HOSPITAL_COMMUNITY)
Admission: AD | Admit: 2014-10-17 | Discharge: 2014-10-19 | DRG: 774 | Disposition: A | Discharge status: Home / Self Care | Payer: Medicaid Other | Source: Ambulatory Visit | Attending: Family Medicine | Admitting: Family Medicine

## 2014-10-17 DIAGNOSIS — O99824 Streptococcus B carrier state complicating childbirth: Secondary | ICD-10-CM | POA: Diagnosis present

## 2014-10-17 DIAGNOSIS — O9842 Viral hepatitis complicating childbirth: Secondary | ICD-10-CM | POA: Diagnosis present

## 2014-10-17 DIAGNOSIS — Z3A40 40 weeks gestation of pregnancy: Secondary | ICD-10-CM | POA: Diagnosis present

## 2014-10-17 DIAGNOSIS — B1910 Unspecified viral hepatitis B without hepatic coma: Secondary | ICD-10-CM

## 2014-10-17 DIAGNOSIS — IMO0001 Reserved for inherently not codable concepts without codable children: Secondary | ICD-10-CM

## 2014-10-17 DIAGNOSIS — O480 Post-term pregnancy: Secondary | ICD-10-CM

## 2014-10-17 DIAGNOSIS — Z3403 Encounter for supervision of normal first pregnancy, third trimester: Secondary | ICD-10-CM | POA: Diagnosis present

## 2014-10-17 HISTORY — DX: Personal history of urinary (tract) infections: Z87.440

## 2014-10-17 LAB — CBC
HCT: 34.1 % — ABNORMAL LOW (ref 36.0–46.0)
Hemoglobin: 11.2 g/dL — ABNORMAL LOW (ref 12.0–15.0)
MCH: 27.2 pg (ref 26.0–34.0)
MCHC: 32.8 g/dL (ref 30.0–36.0)
MCV: 82.8 fL (ref 78.0–100.0)
Platelets: 334 10*3/uL (ref 150–400)
RBC: 4.12 MIL/uL (ref 3.87–5.11)
RDW: 15.2 % (ref 11.5–15.5)
WBC: 11.4 10*3/uL — AB (ref 4.0–10.5)

## 2014-10-17 LAB — TYPE AND SCREEN
ABO/RH(D): A POS
Antibody Screen: NEGATIVE

## 2014-10-17 MED ORDER — PRENATAL MULTIVITAMIN CH: 1.0000 | ORAL_TABLET | Freq: Every day | ORAL | Status: DC

## 2014-10-17 MED ORDER — OXYCODONE-ACETAMINOPHEN 5-325 MG PO TABS: 2.0000 | ORAL_TABLET | ORAL | Status: DC | PRN

## 2014-10-17 MED ORDER — OXYCODONE-ACETAMINOPHEN 5-325 MG PO TABS: 1.0000 | ORAL_TABLET | ORAL | Status: DC | PRN

## 2014-10-17 MED ORDER — LIDOCAINE HCL (PF) 1 % IJ SOLN
30.0000 mL | INTRAMUSCULAR | Status: DC | PRN
  Administered 2014-10-17: 30 mL via SUBCUTANEOUS
  Filled 2014-10-17: qty 30

## 2014-10-17 MED ORDER — EPHEDRINE 5 MG/ML INJ
10.0000 mg | INTRAVENOUS | Status: DC | PRN
  Filled 2014-10-17: qty 2

## 2014-10-17 MED ORDER — DIPHENHYDRAMINE HCL 50 MG/ML IJ SOLN: 12.5000 mg | INTRAMUSCULAR | Status: DC | PRN

## 2014-10-17 MED ORDER — OXYTOCIN 40 UNITS IN LACTATED RINGERS INFUSION - SIMPLE MED
62.5000 mL/h | INTRAVENOUS | Status: DC
  Filled 2014-10-17: qty 1000

## 2014-10-17 MED ORDER — LACTATED RINGERS IV SOLN
INTRAVENOUS | Status: DC
Start: 2014-10-17 — End: 2014-10-18
  Administered 2014-10-17: 17:00:00 via INTRAVENOUS

## 2014-10-17 MED ORDER — ACETAMINOPHEN 325 MG PO TABS: 650.0000 mg | ORAL_TABLET | ORAL | Status: DC | PRN

## 2014-10-17 MED ORDER — FENTANYL 2.5 MCG/ML BUPIVACAINE 1/10 % EPIDURAL INFUSION (WH - ANES): 14.0000 mL/h | INTRAMUSCULAR | Status: DC | PRN

## 2014-10-17 MED ORDER — PHENYLEPHRINE 40 MCG/ML (10ML) SYRINGE FOR IV PUSH (FOR BLOOD PRESSURE SUPPORT)
80.0000 ug | PREFILLED_SYRINGE | INTRAVENOUS | Status: DC | PRN
  Filled 2014-10-17: qty 2

## 2014-10-17 MED ORDER — LACTATED RINGERS IV SOLN: 500.0000 mL | INTRAVENOUS | Status: DC | PRN

## 2014-10-17 MED ORDER — LACTATED RINGERS IV SOLN
500.0000 mL | Freq: Once | INTRAVENOUS | Status: DC
Start: 2014-10-17 — End: 2014-10-18

## 2014-10-17 MED ORDER — CITRIC ACID-SODIUM CITRATE 334-500 MG/5ML PO SOLN: 30.0000 mL | ORAL | Status: DC | PRN

## 2014-10-17 MED ORDER — ONDANSETRON HCL 4 MG/2ML IJ SOLN
4.0000 mg | Freq: Four times a day (QID) | INTRAMUSCULAR | Status: DC | PRN
Start: 2014-10-17 — End: 2014-10-18

## 2014-10-17 MED ORDER — BUTORPHANOL TARTRATE 1 MG/ML IJ SOLN
1.0000 mg | INTRAMUSCULAR | Status: DC | PRN
  Administered 2014-10-17: 1 mg via INTRAVENOUS
  Filled 2014-10-17: qty 1

## 2014-10-17 MED ORDER — OXYTOCIN BOLUS FROM INFUSION
500.0000 mL | INTRAVENOUS | Status: DC
  Administered 2014-10-17: 500 mL via INTRAVENOUS

## 2014-10-17 NOTE — H&P (Signed)
LABOR ADMISSION HISTORY AND PHYSICAL  Joan Sawyer is a 24 y.o. female G1P0 with IUP at [redacted]w[redacted]d by L/24 wk [place] presenting for contractions. She reports onset of painful, frequent contractions this AM starting at 9am, continued to increase in frequency.  She reports +FMs, No LOF, no VB, no blurry vision, headaches or peripheral edema, and RUQ pain.  She plans on formula feeding given + Hepb B status. She request IV pain medicine for pain control and nexplanon.  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 Infant will require HBIG at time of delivery and subsequent HBV vaccination. Patient to continue Tenofovir at least 1 month post partum.  Past Medical History: Past Medical History  Diagnosis Date  . Hepatitis     Past Surgical History: Past Surgical History  Procedure Laterality Date  . No past surgeries      Obstetrical History: OB History    Gravida Para Term Preterm AB TAB SAB Ectopic Multiple Living   1               Social History: History   Social History  . Marital Status: Single    Spouse Name: N/A    Number of Children: N/A  . Years of Education: N/A   Social History Main Topics  . Smoking status: Never Smoker   . Smokeless tobacco: Never Used  . Alcohol Use: No  . Drug Use: No  . Sexual Activity: Yes    Birth Control/ Protection: None   Other Topics Concern  . None   Social History Narrative    Family History: History reviewed. No pertinent family history.  Allergies: No Known Allergies  Facility-administered medications prior to admission  Medication Dose Route Frequency Provider Last Rate Last Dose  . Influenza vac split quadrivalent PF (FLUARIX) injection 0.5 mL  0.5 mL Intramuscular Once Brock Bad, MD       Prescriptions prior to admission  Medication Sig Dispense Refill Last Dose  .  Prenat-FeCbn-FeAspGl-FA-Omega (OB COMPLETE PETITE) 35-5-1-200 MG CAPS Take 1 capsule by mouth daily. 30 capsule 11 10/16/2014 at Unknown time     Review of Systems   All systems reviewed and negative except as stated in HPI  Blood pressure 112/85, pulse 102, temperature 98.3 F (36.8 C), temperature source Oral, resp. rate 16, weight 168 lb (76.204 kg), last menstrual period 01/08/2014. General appearance: alert, cooperative and appears stated age Lungs: clear to auscultation bilaterally Heart: regular rate and rhythm Abdomen: soft, non-tender; bowel sounds normal Pelvic: adequate Extremities: Homans sign is negative, no sign of DVT Presentation: cephalic by US Fetal monitoringBaseline: 140s bpm, Variability: Good {> 6 bpm), Accelerations: Reactive and Decelerations: Absent Uterine activityDate/time of onset: 1/4 at 9am; Frequency: Every 5  minutes, Duration: 30 seconds and Intensity: strong Dilation: 5.5 Effacement (%): 90 Station: -2, -1 Exam by:: K. WeissRN   Prenatal labs: ABO, Rh: A/POS/-- (09/14 1341) Antibody: NEG (09/14 1341) Rubella:   RPR: NON REAC (09/28 1349)  HBsAg: POSITIVE (09/22 1435)  HIV: NONREACTIVE (09/28 1349)  GBS: NOT DETECTED (12/07 1342)  1 hr Glucola 66 Genetic screening  Declined Anatomy US WNL   No results found for this or any previous visit (from the past 24 hour(s)).  Patient Active Problem List   Diagnosis Date Noted  . Encounter for supervision of normal first pregnancy in second trimester 07/06/2014  .  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 Urinary tract infection, site not specified 06/29/2014    Assessment: Joan Sawyer is a 24 y.o. 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 Infant will require HBIG at time of delivery and subsequent HBV vaccination. Patient to  continue Tenofovir at least 1 month post partum.  #ID:  GBS+  #MOF: Formula #MOC: Nexplanon #Circ:  Declines  [name] 10/17/2014, 4:40 PM

## 2014-10-17 NOTE — MAU Note (Signed)
Pains started this morning, getting closer and stronger. No bleeding or leaking

## 2014-10-17 NOTE — Progress Notes (Signed)
Joan Sawyer is a 24 y.o. G1P0000 at [redacted]w[redacted]d by ultrasound admitted for active labor  Subjective:   Objective: BP 127/84 mmHg  Pulse 94  Temp(Src) 97.5 F (36.4 C) (Oral)  Resp 20  Ht  (1.651 m)  Wt 168 lb (76.204 kg)  BMI 27.96 kg/m2  LMP 01/08/2014      FHT:  FHR: 130 bpm, variability: moderate,  accelerations:  Present,  decelerations:  Present variable UC:   regular, every 2-3 minutes SVE:   Dilation: 10 Effacement (%): 100 Station: +1 Exam by:: Joan Seller, MD  Labs: Lab Results  Component Value Date   WBC 11.4* 10/17/2014   HGB 11.2* 10/17/2014   HCT 34.1* 10/17/2014   MCV 82.8 10/17/2014   PLT 334 10/17/2014    Assessment / Plan: Spontaneous labor, progressing normally  Labor: Progressing normally, AROM with clear fluid Fetal Wellbeing:  Category II Pain Control:  Labor support without medications I/D:  GBS neg Anticipated MOD:  NSVD  Joan Sawyer 10/17/2014, 8:22 PM

## 2014-10-18 ENCOUNTER — Encounter (HOSPITAL_COMMUNITY): Payer: Self-pay | Admitting: *Deleted

## 2014-10-18 ENCOUNTER — Encounter: Payer: Medicaid Other | Admitting: Obstetrics

## 2014-10-18 LAB — CBC
HCT: 29.6 % — ABNORMAL LOW (ref 36.0–46.0)
HEMOGLOBIN: 9.7 g/dL — AB (ref 12.0–15.0)
MCH: 26.9 pg (ref 26.0–34.0)
MCHC: 32.8 g/dL (ref 30.0–36.0)
MCV: 82 fL (ref 78.0–100.0)
Platelets: 301 10*3/uL (ref 150–400)
RBC: 3.61 MIL/uL — ABNORMAL LOW (ref 3.87–5.11)
RDW: 15.1 % (ref 11.5–15.5)
WBC: 16.2 10*3/uL — ABNORMAL HIGH (ref 4.0–10.5)

## 2014-10-18 LAB — HIV ANTIBODY (ROUTINE TESTING W REFLEX): HIV: NONREACTIVE

## 2014-10-18 LAB — RPR

## 2014-10-18 LAB — ABO/RH: ABO/RH(D): A POS

## 2014-10-18 MED ORDER — LANOLIN HYDROUS EX OINT: TOPICAL_OINTMENT | CUTANEOUS | Status: DC | PRN

## 2014-10-18 MED ORDER — IBUPROFEN 600 MG PO TABS
600.0000 mg | ORAL_TABLET | Freq: Four times a day (QID) | ORAL | Status: DC
  Administered 2014-10-18 – 2014-10-19 (×6): 600 mg via ORAL
  Filled 2014-10-18 (×6): qty 1

## 2014-10-18 MED ORDER — OXYCODONE-ACETAMINOPHEN 5-325 MG PO TABS
1.0000 | ORAL_TABLET | ORAL | Status: DC | PRN
  Administered 2014-10-19: 1 via ORAL
  Filled 2014-10-18: qty 1

## 2014-10-18 MED ORDER — DIPHENHYDRAMINE HCL 25 MG PO CAPS: 25.0000 mg | ORAL_CAPSULE | Freq: Four times a day (QID) | ORAL | Status: DC | PRN

## 2014-10-18 MED ORDER — ZOLPIDEM TARTRATE 5 MG PO TABS: 5.0000 mg | ORAL_TABLET | Freq: Every evening | ORAL | Status: DC | PRN

## 2014-10-18 MED ORDER — SIMETHICONE 80 MG PO CHEW: 80.0000 mg | CHEWABLE_TABLET | ORAL | Status: DC | PRN

## 2014-10-18 MED ORDER — DIBUCAINE 1 % RE OINT: 1.0000 "application " | TOPICAL_OINTMENT | RECTAL | Status: DC | PRN

## 2014-10-18 MED ORDER — ONDANSETRON HCL 4 MG/2ML IJ SOLN: 4.0000 mg | INTRAMUSCULAR | Status: DC | PRN

## 2014-10-18 MED ORDER — ONDANSETRON HCL 4 MG PO TABS: 4.0000 mg | ORAL_TABLET | ORAL | Status: DC | PRN

## 2014-10-18 MED ORDER — TETANUS-DIPHTH-ACELL PERTUSSIS 5-2.5-18.5 LF-MCG/0.5 IM SUSP: 0.5000 mL | Freq: Once | INTRAMUSCULAR | Status: DC

## 2014-10-18 MED ORDER — OXYCODONE-ACETAMINOPHEN 5-325 MG PO TABS: 2.0000 | ORAL_TABLET | ORAL | Status: DC | PRN

## 2014-10-18 MED ORDER — WITCH HAZEL-GLYCERIN EX PADS: 1.0000 "application " | MEDICATED_PAD | CUTANEOUS | Status: DC | PRN

## 2014-10-18 MED ORDER — PRENATAL MULTIVITAMIN CH
1.0000 | ORAL_TABLET | Freq: Every day | ORAL | Status: DC
  Administered 2014-10-18 – 2014-10-19 (×2): 1 via ORAL
  Filled 2014-10-18 (×2): qty 1

## 2014-10-18 MED ORDER — BENZOCAINE-MENTHOL 20-0.5 % EX AERO: 1.0000 "application " | INHALATION_SPRAY | CUTANEOUS | Status: DC | PRN

## 2014-10-18 MED ORDER — SENNOSIDES-DOCUSATE SODIUM 8.6-50 MG PO TABS
2.0000 | ORAL_TABLET | ORAL | Status: DC
  Administered 2014-10-19: 2 via ORAL
  Filled 2014-10-18: qty 2

## 2014-10-18 NOTE — Progress Notes (Signed)
UR chart review completed.  

## 2014-10-18 NOTE — Progress Notes (Signed)
Post Partum Day 1 Subjective: no complaints, up ad lib, voiding, tolerating PO and + flatus  Objective: Blood pressure 111/80, pulse 99, temperature 98.7 F (37.1 C), temperature source Oral, resp. rate 18, height 5\' 5"  (1.651 m), weight 76.204 kg (168 lb), last menstrual period 01/08/2014, unknown if currently breastfeeding.  Physical Exam:  General: alert and no distress Lochia: appropriate Uterine Fundus: firm DVT Evaluation: No evidence of DVT seen on physical exam. Negative Homan's sign. No significant calf/ankle edema.   Recent Labs  10/17/14 1745 10/18/14 0554  HGB 11.2* 9.7*  HCT 34.1* 29.6*    Assessment/Plan: Plan for discharge tomorrow Formula feeding due to antiviral for +HBsAg Contraception plan: nexplanon   LOS: 1 day   Joan Sawyer, Joan Sawyer 10/18/2014, 8:00 AM

## 2014-10-19 NOTE — Discharge Summary (Signed)
Obstetric Discharge Summary Reason for Admission: onset of labor Prenatal Procedures: NST and ultrasound Intrapartum Procedures: spontaneous vaginal delivery Postpartum Procedures: none Complications-Operative and Postpartum: 2nd degree perineal laceration HEMOGLOBIN  Date Value Ref Range Status  10/18/2014 9.7* 12.0 - 15.0 g/dL Final   HCT  Date Value Ref Range Status  10/18/2014 29.6* 36.0 - 46.0 % Final   Joan Sawyer is a 24 y.o. burmese female who is now G1P1001 after delivering a baby boy by NSVD at 6472w2d. She sustained a 2nd degree perineal laceration which was repaired. No postpartum complications occurred. She is formula feeding due to treatment with tenofovir, though this is not an absolute contraindication. She is to continue antiviral treatment for hepatitis B infection for at least 1 month postpartum. She desires nexplanon for contraception and does not desire circumcision.  Delivery Note At 9:15 PM a viable female was delivered via Vaginal, Spontaneous Delivery (Presentation: ; ). APGAR: 9, 9; weight .  Placenta status: Intact, spontaneous. Cord: 3v with the following complications: none . Cord pH: not obtained  Anesthesia: None Episiotomy: None Lacerations: 2nd Suture Repair: 3.0 vicryl Est. Blood Loss (mL): 300  Physical Exam:  General: alert and no distress Lochia: appropriate Uterine Fundus: firm DVT Evaluation: No evidence of DVT seen on physical exam.  Discharge Diagnoses: Term Pregnancy-delivered  Discharge Information: Date: 10/19/2014 Activity: pelvic rest Diet: routine Medications: Ibuprofen Condition: stable Instructions: refer to practice specific booklet Discharge to: home Follow-up Information    Follow up with [facility] In 6 weeks.   Contact information:   [address] [place] [id number]       Newborn Data: Live born female  Birth Weight: 9 lb 8.7 oz (4329 g) APGAR: 8, 9  Home with mother and father.  Hazeline JunkerGrunz,  Hilario Robarts 10/19/2014, 9:01 AM

## 2014-10-19 NOTE — Progress Notes (Signed)
While on the phone with language line, pt. Expresses concern about birth control.  Pt. Wants to get birth control now because she does not want to be pregnant again.  Teaching service resident notified of pt request to get birth control.   RN requested for pt. To get nexplanon per pt. Request.  PT also tells RN through interpreter that she has no carseat and no cash for formula and that she cannot feed her baby the breast due to her disease.

## 2014-10-19 NOTE — Progress Notes (Signed)
Clinical Social Work Department PSYCHOSOCIAL ASSESSMENT - MATERNAL/CHILD 10/19/2014  Patient:  Joan Sawyer,Joan Sawyer  Account Number:  [id number]  Admit Date:  10/17/2014  Childs Name:   Joshua Sawyer   Clinical Social Worker:  Eilene Voigt, CLINICAL SOCIAL WORKER   Date/Time:  10/19/2014 02:30 PM  Date Referred:  10/19/2014   Referral source  RN     Referred reason  Psychosocial assessment   Other referral source:    I:  FAMILY / HOME ENVIRONMENT Child's legal guardian:  PARENT  Guardian - Name Guardian - Age Guardian - Address  Joan Sawyer 24 [address] [place], [phone]  Joan Sawyer  same as above   Other household support members/support persons Other support:   MOB and FOB reported no social support.  They stated that they have no family, friends, or church community.    II  PSYCHOSOCIAL DATA Information Source:  Family Interview  Financial and Community Resources Employment:   Per MOB, the FOB is employed.   Financial resources:  Medicaid If Medicaid - County:  [place] Other  Food Stamps  WIC   School / Grade:  N/A Maternity Care Coordinator / Child Services Coordination / Early Interventions:   None reported  Cultural issues impacting care:   MOB and FOB speak Burmese.    III  STRENGTHS Strengths  Compliance with medical plan   Strength comment:    IV  RISK FACTORS AND CURRENT PROBLEMS Current Problem:  YES   Risk Factor & Current Problem Patient Issue Family Issue Risk Factor / Current Problem Comment  Basic Needs (food,housing,etc) Y N MOB and FOB reported that they do not have basic baby supplies for the baby.    V  SOCIAL WORK ASSESSMENT CSW met with the MOB after receiving call from nursing staff that the MOB and FOB have reported that they do not have formula or basic baby supplies.  CSW completed assessment with the assistance of phone interpreter as the family's primary language is Burmese.  CSW noted that the MOB did not maintain eye contact  and that the MOB was difficult to engage.  She provided short/concise answers, and displayed a limited range in affect.   MOB and FOB reported that they live together in an apartment.  Per MOB, they have "nothing" for the baby, but then later stated that they have a "few" diapers and 4-5 outfits.  CSW inquired about additional preparations for the baby, and they stated that they had "nothing".  CSW inquired about there plans to care for the baby once the baby was born, and she did not respond.  Per MOB and FOB, they are in [place] "alone", and denied presence of family, friends, or coworkers that can assist them with the basic needs.  They stated that they pay their rent and meet their basic needs from the FOB's income and their Food Stamps.  MOB also confirmed enrollment in WIC.   During the assessment, the MOB and FOB received a bundle of clothing and a car seat from [employer] as they had reported that they did not have a car seat either.  They expressed appreciation for these items.   CSW directly inquired about their plans to obtain formula.  They continued to not respond.  CSW informed the family that the hospital is unable to provide them with formula, and stated that WIC and Food Stamps only supplement the formula.  CSW discussed that the MOB and FOB will need to identify/create a   plan to obtain formula.  The MOB responded by saying, "If you cannot give it to us, he'll be able to go buy some".  CSW confirmed on two different occassions that the FOB has the money/necessary funds to go to the store and buy formula.   CSW expressed concern about the baby's health and well being since there have been few preparations and appear to have minimal basic needs met for the baby.  CSW discussed referral for CPS in order to provide the family with additional support and to ensure that basic needs are met.  The MOB and FOB verbalized understanding of the report, and were agreeable.    VI SOCIAL WORK  PLAN Social Work Plan  Child Protective Services Report  Information/Referral to Community Resources  No Further Intervention Required / No Barriers to Discharge   Type of pt/family education:  N/A If child protective services report - county:  [place] If child protective services report - date:  10/19/2014 CSW spoke to Pamela Miller, CPS intake worker.  CSW made report due to concerns about lack of basic needs for the baby. CSW confirmed that the family has housing, a car seat, and a few items of clothing.  CSW also shared with CPS that the hospital provided the family with a limited supply of formula to ensure that the baby gets fed tonight.  CSW requested that CPS follow up with the family in the community to ensure that the home is appropriate/safe and that there is enough food in the home for the baby.   Information/referral to community resources comment:   [facility]   Other social work plan:   CSW to follow up PRN.     

## 2014-10-19 NOTE — Discharge Instructions (Signed)
Please call to make a follow-up appointment with your OB/GYN in one week to have the Nexplanon placed.   Return to [facility] if your bleeding increases or if you experience fever, increased perineal or abdominal pain, breast problems, or leg pain or swelling.   Postpartum Care After Vaginal Delivery After you deliver your newborn (postpartum period), the usual stay in the hospital is 24-72 hours. If there were problems with your labor or delivery, or if you have other medical problems, you might be in the hospital longer.  While you are in the hospital, you will receive help and instructions on how to care for yourself and your newborn during the postpartum period.  While you are in the hospital:  Be sure to tell your nurses if you have pain or discomfort, as well as where you feel the pain and what makes the pain worse.  If you had an incision made near your vagina (episiotomy) or if you had some tearing during delivery, the nurses may put ice packs on your episiotomy or tear. The ice packs may help to reduce the pain and swelling.  If you are breastfeeding, you may feel uncomfortable contractions of your uterus for a couple of weeks. This is normal. The contractions help your uterus get back to normal size.  It is normal to have some bleeding after delivery.  For the first 1-3 days after delivery, the flow is red and the amount may be similar to a period.  It is common for the flow to start and stop.  In the first few days, you may pass some small clots. Let your nurses know if you begin to pass large clots or your flow increases.  Do not  flush blood clots down the toilet before having the nurse look at them.  During the next 3-10 days after delivery, your flow should become more watery and pink or brown-tinged in color.  Ten to fourteen days after delivery, your flow should be a small amount of yellowish-white discharge.  The amount of your flow will decrease over the first  few weeks after delivery. Your flow may stop in 6-8 weeks. Most women have had their flow stop by 12 weeks after delivery.  You should change your sanitary pads frequently.  Wash your hands thoroughly with soap and water for at least 20 seconds after changing pads, using the toilet, or before holding or feeding your newborn.  You should feel like you need to empty your bladder within the first 6-8 hours after delivery.  In case you become weak, lightheaded, or faint, call your nurse before you get out of bed for the first time and before you take a shower for the first time.  Within the first few days after delivery, your breasts may begin to feel tender and full. This is called engorgement. Breast tenderness usually goes away within 48-72 hours after engorgement occurs. You may also notice milk leaking from your breasts. If you are not breastfeeding, do not stimulate your breasts. Breast stimulation can make your breasts produce more milk.  Spending as much time as possible with your newborn is very important. During this time, you and your newborn can feel close and get to know each other. Having your newborn stay in your room (rooming in) will help to strengthen the bond with your newborn. It will give you time to get to know your newborn and become comfortable caring for your newborn.  Your hormones change after delivery. Sometimes the hormone  changes can temporarily cause you to feel sad or tearful. These feelings should not last more than a few days. If these feelings last longer than that, you should talk to your caregiver.  If desired, talk to your caregiver about methods of family planning or contraception.  Talk to your caregiver about immunizations. Your caregiver may want you to have the following immunizations before leaving the hospital:  Tetanus, diphtheria, and pertussis (Tdap) or tetanus and diphtheria (Td) immunization. It is very important that you and your family (including  grandparents) or others caring for your newborn are up-to-date with the Tdap or Td immunizations. The Tdap or Td immunization can help protect your newborn from getting ill.  Rubella immunization.  Varicella (chickenpox) immunization.  Influenza immunization. You should receive this annual immunization if you did not receive the immunization during your pregnancy. Document Released: 07/28/2007 Document Revised: 06/24/2012 Document Reviewed: 05/27/2012 [facility] Patient Information 2015 [place], [place]. This information is not intended to replace advice given to you by your health care provider. Make sure you discuss any questions you have with your health care provider.

## 2015-03-26 ENCOUNTER — Encounter (HOSPITAL_COMMUNITY): Payer: Self-pay

## 2015-03-26 ENCOUNTER — Emergency Department (HOSPITAL_COMMUNITY)
Admission: EM | Admit: 2015-03-26 | Discharge: 2015-03-26 | Disposition: A | Discharge status: Home / Self Care | Payer: Medicaid Other | Attending: Emergency Medicine | Admitting: Emergency Medicine

## 2015-03-26 ENCOUNTER — Emergency Department (HOSPITAL_COMMUNITY): Payer: Medicaid Other

## 2015-03-26 DIAGNOSIS — Z79899 Other long term (current) drug therapy: Secondary | ICD-10-CM | POA: Insufficient documentation

## 2015-03-26 DIAGNOSIS — Z8619 Personal history of other infectious and parasitic diseases: Secondary | ICD-10-CM | POA: Insufficient documentation

## 2015-03-26 DIAGNOSIS — N72 Inflammatory disease of cervix uteri: Secondary | ICD-10-CM | POA: Diagnosis not present

## 2015-03-26 DIAGNOSIS — N12 Tubulo-interstitial nephritis, not specified as acute or chronic: Secondary | ICD-10-CM | POA: Insufficient documentation

## 2015-03-26 DIAGNOSIS — Z3202 Encounter for pregnancy test, result negative: Secondary | ICD-10-CM | POA: Insufficient documentation

## 2015-03-26 DIAGNOSIS — R102 Pelvic and perineal pain: Secondary | ICD-10-CM

## 2015-03-26 DIAGNOSIS — R1030 Lower abdominal pain, unspecified: Secondary | ICD-10-CM | POA: Diagnosis present

## 2015-03-26 LAB — CBC WITH DIFFERENTIAL/PLATELET
Basophils Absolute: 0 10*3/uL (ref 0.0–0.1)
Basophils Relative: 0 % (ref 0–1)
EOS ABS: 0 10*3/uL (ref 0.0–0.7)
EOS PCT: 0 % (ref 0–5)
HEMATOCRIT: 33.8 % — AB (ref 36.0–46.0)
HEMOGLOBIN: 11.3 g/dL — AB (ref 12.0–15.0)
LYMPHS PCT: 19 % (ref 12–46)
Lymphs Abs: 1.6 10*3/uL (ref 0.7–4.0)
MCH: 26.4 pg (ref 26.0–34.0)
MCHC: 33.4 g/dL (ref 30.0–36.0)
MCV: 79 fL (ref 78.0–100.0)
Monocytes Absolute: 0.5 10*3/uL (ref 0.1–1.0)
Monocytes Relative: 7 % (ref 3–12)
NEUTROS ABS: 6.1 10*3/uL (ref 1.7–7.7)
Neutrophils Relative %: 74 % (ref 43–77)
Platelets: 233 10*3/uL (ref 150–400)
RBC: 4.28 MIL/uL (ref 3.87–5.11)
RDW: 16.5 % — AB (ref 11.5–15.5)
WBC: 8.2 10*3/uL (ref 4.0–10.5)

## 2015-03-26 LAB — COMPREHENSIVE METABOLIC PANEL
ALBUMIN: 3.5 g/dL (ref 3.5–5.0)
ALK PHOS: 55 U/L (ref 38–126)
ALT: 43 U/L (ref 14–54)
AST: 26 U/L (ref 15–41)
Anion gap: 11 (ref 5–15)
BILIRUBIN TOTAL: 0.8 mg/dL (ref 0.3–1.2)
BUN: 8 mg/dL (ref 6–20)
CO2: 20 mmol/L — ABNORMAL LOW (ref 22–32)
Calcium: 8.5 mg/dL — ABNORMAL LOW (ref 8.9–10.3)
Chloride: 101 mmol/L (ref 101–111)
Creatinine, Ser: 0.78 mg/dL (ref 0.44–1.00)
GFR calc Af Amer: >60 mL/min (ref >60)
GFR calc non Af Amer: >60 mL/min (ref >60)
Glucose, Bld: 106 mg/dL — ABNORMAL HIGH (ref 65–99)
Potassium: 3.3 mmol/L — ABNORMAL LOW (ref 3.5–5.1)
Sodium: 132 mmol/L — ABNORMAL LOW (ref 135–145)
Total Protein: 7.3 g/dL (ref 6.5–8.1)

## 2015-03-26 LAB — WET PREP, GENITAL
Clue Cells Wet Prep HPF POC: NONE SEEN
Trich, Wet Prep: NONE SEEN
Yeast Wet Prep HPF POC: NONE SEEN

## 2015-03-26 LAB — URINALYSIS, ROUTINE W REFLEX MICROSCOPIC
Bilirubin Urine: NEGATIVE
GLUCOSE, UA: NEGATIVE mg/dL
HGB URINE DIPSTICK: NEGATIVE
NITRITE: POSITIVE — AB
PROTEIN: NEGATIVE mg/dL
SPECIFIC GRAVITY, URINE: 1.019 (ref 1.005–1.030)
Urobilinogen, UA: 1 mg/dL (ref 0.0–1.0)
pH: 6 (ref 5.0–8.0)

## 2015-03-26 LAB — URINE MICROSCOPIC-ADD ON

## 2015-03-26 LAB — LIPASE, BLOOD: Lipase: 17 U/L — ABNORMAL LOW (ref 22–51)

## 2015-03-26 LAB — POC URINE PREG, ED: Preg Test, Ur: NEGATIVE

## 2015-03-26 MED ORDER — IBUPROFEN 600 MG PO TABS: 600.0000 mg | ORAL_TABLET | Freq: Four times a day (QID) | ORAL | Status: DC | PRN

## 2015-03-26 MED ORDER — DOXYCYCLINE HYCLATE 100 MG PO CAPS: 100.0000 mg | ORAL_CAPSULE | Freq: Two times a day (BID) | ORAL | Status: DC

## 2015-03-26 MED ORDER — KETOROLAC TROMETHAMINE 30 MG/ML IJ SOLN
30.0000 mg | Freq: Once | INTRAMUSCULAR | Status: AC
  Administered 2015-03-26: 30 mg via INTRAVENOUS
  Filled 2015-03-26: qty 1

## 2015-03-26 MED ORDER — CEPHALEXIN 500 MG PO CAPS
500.0000 mg | ORAL_CAPSULE | Freq: Four times a day (QID) | ORAL | Status: DC
Start: 2015-03-26 — End: 2015-12-25

## 2015-03-26 MED ORDER — DEXTROSE 5 % IV SOLN
1.0000 g | Freq: Once | INTRAVENOUS | Status: AC
  Administered 2015-03-26: 1 g via INTRAVENOUS
  Filled 2015-03-26: qty 10

## 2015-03-26 MED ORDER — PROMETHAZINE HCL 12.5 MG PO TABS: 12.5000 mg | ORAL_TABLET | Freq: Four times a day (QID) | ORAL | Status: DC | PRN

## 2015-03-26 MED ORDER — DOXYCYCLINE HYCLATE 100 MG PO TABS
100.0000 mg | ORAL_TABLET | Freq: Two times a day (BID) | ORAL | Status: DC
  Administered 2015-03-26: 100 mg via ORAL
  Filled 2015-03-26: qty 1

## 2015-03-26 MED ORDER — SODIUM CHLORIDE 0.9 % IV BOLUS (SEPSIS)
1000.0000 mL | Freq: Once | INTRAVENOUS | Status: AC
  Administered 2015-03-26: 1000 mL via INTRAVENOUS

## 2015-03-26 MED ORDER — ACETAMINOPHEN 325 MG PO TABS
650.0000 mg | ORAL_TABLET | Freq: Once | ORAL | Status: AC
  Administered 2015-03-26: 650 mg via ORAL
  Filled 2015-03-26: qty 2

## 2015-03-26 MED ORDER — ONDANSETRON HCL 4 MG/2ML IJ SOLN
4.0000 mg | Freq: Once | INTRAMUSCULAR | Status: AC
Start: 2015-03-26 — End: 2015-03-26
  Administered 2015-03-26: 4 mg via INTRAVENOUS
  Filled 2015-03-26: qty 2

## 2015-03-26 NOTE — ED Notes (Signed)
Patient transported to Ultrasound 

## 2015-03-26 NOTE — Discharge Instructions (Signed)
Take ibuprofen for pain. Phenergan as needed for nausea. Take Keflex and doxycycline as prescribed, take until all gone. Follow-up with your doctor. Return if worsening symptoms.   Cervicitis Cervicitis is a soreness and swelling (inflammation) of the cervix. Your cervix is located at the bottom of your uterus. It opens up to the vagina. CAUSES   Sexually transmitted infections (STIs).   Allergic reaction.   Medicines or birth control devices that are put in the vagina.   Injury to the cervix.   Bacterial infections.  RISK FACTORS You are at greater risk if you:  Have unprotected sexual intercourse.  Have sexual intercourse with many partners.  Began sexual intercourse at an early age.  Have a history of STIs. SYMPTOMS  There may be no symptoms. If symptoms occur, they may include:   Gray, white, yellow, or bad-smelling vaginal discharge.   Pain or itching of the area outside the vagina.   Painful sexual intercourse.   Lower abdominal or lower back pain, especially during intercourse.   Frequent urination.   Abnormal vaginal bleeding between periods, after sexual intercourse, or after menopause.   Pressure or a heavy feeling in the pelvis.  DIAGNOSIS  Diagnosis is made after a pelvic exam. Other tests may include:   Examination of any discharge under a microscope (wet prep).   A Pap test.  TREATMENT  Treatment will depend on the cause of cervicitis. If it is caused by an STI, both you and your partner will need to be treated. Antibiotic medicines will be given.  HOME CARE INSTRUCTIONS   Do not have sexual intercourse until your health care provider says it is okay.   Do not have sexual intercourse until your partner has been treated, if your cervicitis is caused by an STI.   Take your antibiotics as directed. Finish them even if you start to feel better.  SEEK MEDICAL CARE IF:  Your symptoms come back.   You have a fever.  MAKE SURE  YOU:   Understand these instructions.  Will watch your condition.  Will get help right away if you are not doing well or get worse. Document Released: 09/30/2005 Document Revised: 10/05/2013 Document Reviewed: 03/24/2013 [facility] Patient Information 2015 [place], [place]. This information is not intended to replace advice given to you by your health care provider. Make sure you discuss any questions you have with your health care provider.  Pyelonephritis, Adult Pyelonephritis is a kidney infection. In general, there are 2 main types of pyelonephritis:  Infections that come on quickly without any warning (acute pyelonephritis).  Infections that persist for a long period of time (chronic pyelonephritis). CAUSES  Two main causes of pyelonephritis are:  Bacteria traveling from the bladder to the kidney. This is a problem especially in pregnant women. The urine in the bladder can become filled with bacteria from multiple causes, including:  Inflammation of the prostate gland (prostatitis).  Sexual intercourse in females.  Bladder infection (cystitis).  Bacteria traveling from the bloodstream to the tissue part of the kidney. Problems that may increase your risk of getting a kidney infection include:  Diabetes.  Kidney stones or bladder stones.  Cancer.  Catheters placed in the bladder.  Other abnormalities of the kidney or ureter. SYMPTOMS   Abdominal pain.  Pain in the side or flank area.  Fever.  Chills.  Upset stomach.  Blood in the urine (dark urine).  Frequent urination.  Strong or persistent urge to urinate.  Burning or stinging when urinating. DIAGNOSIS  Your caregiver may diagnose your kidney infection based on your symptoms. A urine sample may also be taken. TREATMENT  In general, treatment depends on how severe the infection is.   If the infection is mild and caught early, your caregiver may treat you with oral antibiotics and send you home.  If  the infection is more severe, the bacteria may have gotten into the bloodstream. This will require intravenous (IV) antibiotics and a hospital stay. Symptoms may include:  High fever.  Severe flank pain.  Shaking chills.  Even after a hospital stay, your caregiver may require you to be on oral antibiotics for a period of time.  Other treatments may be required depending upon the cause of the infection. HOME CARE INSTRUCTIONS   Take your antibiotics as directed. Finish them even if you start to feel better.  Make an appointment to have your urine checked to make sure the infection is gone.  Drink enough fluids to keep your urine clear or pale yellow.  Take medicines for the bladder if you have urgency and frequency of urination as directed by your caregiver. SEEK IMMEDIATE MEDICAL CARE IF:   You have a fever or persistent symptoms for more than 2-3 days.  You have a fever and your symptoms suddenly get worse.  You are unable to take your antibiotics or fluids.  You develop shaking chills.  You experience extreme weakness or fainting.  There is no improvement after 2 days of treatment. MAKE SURE YOU:  Understand these instructions.  Will watch your condition.  Will get help right away if you are not doing well or get worse. Document Released: 09/30/2005 Document Revised: 03/31/2012 Document Reviewed: 03/06/2011 [facility] Patient Information 2015 [place], [place]. This information is not intended to replace advice given to you by your health care provider. Make sure you discuss any questions you have with your health care provider.

## 2015-03-26 NOTE — ED Provider Notes (Signed)
CSN: [id number]     Arrival date & time 03/26/15  1347 History   First MD Initiated Contact with Patient 03/26/15 1537     Chief Complaint  Patient presents with  . Abdominal Pain     (Consider location/radiation/quality/duration/timing/severity/associated sxs/prior Treatment)  Pt is Bermese speaking only, interpreter phone was used.   HPI Joan Sawyer is a 24 y.o. female with hx of hep B, presents to ED with complaint of lower abdominal pain, nausea, vomiting for 3 days. Patient states pain is in the lower abdomen, radiates to the left side. Reports associated nausea vomiting, unable to keep anything down for 3 days. No diarrhea. She reports urinary frequency and urgency, states urinating small amounts at a time. History of the same. No medications tried prior to coming in. She admits to fever and chills. Denies any vaginal discharge or bleeding. Denies possibility of being pregnant. Nothing making her symptoms that are worse. No other complaints.   Past Medical History  Diagnosis Date  . Hepatitis     Hep B positive  . Hx: UTI (urinary tract infection)    Past Surgical History  Procedure Laterality Date  . No past surgeries     History reviewed. No pertinent family history. History  Substance Use Topics  . Smoking status: Never Smoker   . Smokeless tobacco: Never Used  . Alcohol Use: No   OB History    Gravida Para Term Preterm AB TAB SAB Ectopic Multiple Living   0 0 0 0 0 0 1     Review of Systems  Constitutional: Positive for fever and chills.  Respiratory: Negative for cough, chest tightness and shortness of breath.   Cardiovascular: Negative for chest pain, palpitations and leg swelling.  Gastrointestinal: Positive for nausea, vomiting and abdominal pain. Negative for diarrhea.  Genitourinary: Positive for dysuria, urgency, frequency, flank pain and pelvic pain. Negative for vaginal bleeding, vaginal discharge and vaginal pain.  Musculoskeletal: Negative for  myalgias, arthralgias, neck pain and neck stiffness.  Skin: Negative for rash.  Neurological: Negative for dizziness, weakness and headaches.  All other systems reviewed and are negative.     Allergies  Review of patient's allergies indicates no known allergies.  Home Medications   Prior to Admission medications   Medication Sig Start Date End Date Taking? Authorizing Provider  Prenat-FeCbn-FeAspGl-FA-Omega (OB COMPLETE PETITE) 35-5-1-200 MG CAPS Take 1 capsule by mouth daily. 07/25/14   Brock Bad, MD   BP 104/63 mmHg  Pulse 110  Temp(Src) 99.9 F (37.7 C) (Oral)  Resp 18  Wt 146 lb 12.8 oz (66.588 kg)  SpO2 98% Physical Exam  Constitutional: She is oriented to person, place, and time. She appears well-developed and well-nourished. No distress.  HENT:  Head: Normocephalic.  Eyes: Conjunctivae are normal.  Neck: Neck supple.  Cardiovascular: Normal rate, regular rhythm and normal heart sounds.   Pulmonary/Chest: Effort normal and breath sounds normal. No respiratory distress. She has no wheezes. She has no rales.  Abdominal: Soft. Bowel sounds are normal. She exhibits no distension. There is tenderness. There is no rebound and no guarding.  Left CVA tenderness. Left lower quadrant suprapubic tenderness.  Genitourinary:  Normal external genitalia. White large vaginal discharge. Cervix erythematous. Mild cervical motion tenderness. Uterine and left adnexal tenderness.   Musculoskeletal: She exhibits no edema.  Neurological: She is alert and oriented to person, place, and time.  Skin: Skin is warm and dry.  Psychiatric: She has a normal mood and affect.  Her behavior is normal.  Nursing note and vitals reviewed.   ED Course  Procedures (including critical care time) Labs Review Labs Reviewed  CBC WITH DIFFERENTIAL/PLATELET - Abnormal; Notable for the following:    Hemoglobin 11.3 (*)    HCT 33.8 (*)    RDW 16.5 (*)    All other components within normal limits   COMPREHENSIVE METABOLIC PANEL - Abnormal; Notable for the following:    Sodium 132 (*)    Potassium 3.3 (*)    CO2 20 (*)    Glucose, Bld 106 (*)    Calcium 8.5 (*)    All other components within normal limits  LIPASE, BLOOD - Abnormal; Notable for the following:    Lipase 17 (*)    All other components within normal limits  URINALYSIS, ROUTINE W REFLEX MICROSCOPIC (NOT AT [facility])  POC URINE PREG, ED    Imaging Review US Transvaginal Non-ob  03/26/2015   CLINICAL DATA:  Abdominal pain, left adnexal tenderness  EXAM: TRANSABDOMINAL AND TRANSVAGINAL ULTRASOUND OF PELVIS  TECHNIQUE: Both transabdominal and transvaginal ultrasound examinations of the pelvis were performed. Transabdominal technique was performed for global imaging of the pelvis including uterus, ovaries, adnexal regions, and pelvic cul-de-sac. It was necessary to proceed with endovaginal exam following the transabdominal exam to visualize the endometrium and ovaries.  COMPARISON:  None  FINDINGS: Uterus  Measurements: 7.4 x 4.4 x 5.2 cm. No fibroids or other mass visualized.  Endometrium  Thickness: 5 mm.  No focal abnormality visualized.  Right ovary  Measurements: 4 x 3.5 x 3.6 cm. Normal appearance/no adnexal mass. Multiple bilateral follicles.  Left ovary  Measurements: 4.4 x 2.3 x 3.5 cm. Normal appearance/no adnexal mass. Multiple bilateral follicles.  Other findings  No free fluid.  IMPRESSION: Normal pelvic ultrasound.   Electronically Signed   By: Elige Ko   On: 03/26/2015 19:16   US Pelvis Complete  03/26/2015   CLINICAL DATA:  Abdominal pain, left adnexal tenderness  EXAM: TRANSABDOMINAL AND TRANSVAGINAL ULTRASOUND OF PELVIS  TECHNIQUE: Both transabdominal and transvaginal ultrasound examinations of the pelvis were performed. Transabdominal technique was performed for global imaging of the pelvis including uterus, ovaries, adnexal regions, and pelvic cul-de-sac. It was necessary to proceed with endovaginal exam following  the transabdominal exam to visualize the endometrium and ovaries.  COMPARISON:  None  FINDINGS: Uterus  Measurements: 7.4 x 4.4 x 5.2 cm. No fibroids or other mass visualized.  Endometrium  Thickness: 5 mm.  No focal abnormality visualized.  Right ovary  Measurements: 4 x 3.5 x 3.6 cm. Normal appearance/no adnexal mass. Multiple bilateral follicles.  Left ovary  Measurements: 4.4 x 2.3 x 3.5 cm. Normal appearance/no adnexal mass. Multiple bilateral follicles.  Other findings  No free fluid.  IMPRESSION: Normal pelvic ultrasound.   Electronically Signed   By: Elige Ko   On: 03/26/2015 19:16     EKG Interpretation None      MDM   Final diagnoses:  Cervicitis  Pyelonephritis   Patient with lower abdominal pain, radiates to the lower left back, he had very frequency and urgency, no vaginal discharge, nausea and vomiting. Symptoms and exam findings concerning for pyelonephritis. Patient's temperature here is 99.9, will order some Tylenol. She is mildly tachycardic. Will order IV fluid bolus. Lab work and urinalysis pending.  8:58 PM 2 numerous to count white blood cells on wet prep, question PID versus cervicitis. UA with many bacteria and too numerous to count white blood cells as well. Patient  received 1 g of Rocephin IV in emergency department. Discussed with pharmacy, no need for  IM since received IV dose in ED.  US pelvis was obtained to ro TOA, and is negative. Pt is afebrile. VS normal. Tolerating PO fluids in ED. Pain improved. Will treat outpatient with keflex and doxycycline. Phenergan for nausea.   Return precautions strictly discussed with pt and she voiced understanding, this was again done using burmese translator.    Filed Vitals:   03/26/15 2030 03/26/15 2045 03/26/15 2100 03/26/15 2122  BP: 114/67  Pulse: 73 70 71 82  Temp:    98.7 F (37.1 C)  TempSrc:    Oral  Resp:   16 16  Weight:      SpO2: 98% 98% 98% 99%      Jaynie Crumble,  PA-C 03/26/15 2325  Purvis Sheffield, MD 03/27/15 1232

## 2015-03-26 NOTE — ED Notes (Signed)
Onset 3 days LLQ abd pain, heaache and fevers.  Vomiting yesterday.

## 2015-03-28 LAB — GC/CHLAMYDIA PROBE AMP (~~LOC~~) NOT AT ARMC
Chlamydia: NEGATIVE
NEISSERIA GONORRHEA: NEGATIVE

## 2015-03-29 LAB — URINE CULTURE: Colony Count: >=100000

## 2015-03-30 ENCOUNTER — Telehealth (HOSPITAL_BASED_OUTPATIENT_CLINIC_OR_DEPARTMENT_OTHER): Payer: Self-pay | Admitting: Emergency Medicine

## 2015-03-30 NOTE — Telephone Encounter (Signed)
Post ED Visit - Positive Culture Follow-up  Culture report reviewed by antimicrobial stewardship pharmacist: []  Wes Dulaney, Pharm.D., BCPS [x]  Celedonio Miyamoto, Pharm.D., BCPS []  Georgina Pillion, Pharm.D., BCPS []  [place], [address].D., BCPS, AAHIVP []  Estella Husk, Pharm.D., BCPS, AAHIVP []  Elder Cyphers, [address].D., BCPS  Positive urine  Culture E.Coli Treated with doxycycline and cephalexin, organism sensitive to the same and no further patient follow-up is required at this time.  Berle Mull 03/30/2015, 2:00 PM

## 2015-12-25 ENCOUNTER — Encounter (HOSPITAL_COMMUNITY): Payer: Self-pay | Admitting: Emergency Medicine

## 2015-12-25 ENCOUNTER — Emergency Department (INDEPENDENT_AMBULATORY_CARE_PROVIDER_SITE_OTHER)
Admission: EM | Admit: 2015-12-25 | Discharge: 2015-12-25 | Disposition: A | Discharge status: Home / Self Care | Payer: Medicaid Other | Source: Home / Self Care | Attending: Emergency Medicine | Admitting: Emergency Medicine

## 2015-12-25 DIAGNOSIS — H9313 Tinnitus, bilateral: Secondary | ICD-10-CM

## 2015-12-25 DIAGNOSIS — L299 Pruritus, unspecified: Secondary | ICD-10-CM | POA: Diagnosis not present

## 2015-12-25 MED ORDER — NEOMYCIN-POLYMYXIN-HC 3.5-10000-1 OT SUSP
4.0000 [drp] | Freq: Three times a day (TID) | OTIC | Status: DC
Start: 2015-12-25 — End: 2018-02-25

## 2015-12-25 NOTE — ED Provider Notes (Signed)
CSN: [id number]     Arrival date & time 12/25/15  1303 History   First MD Initiated Contact with Patient 12/25/15 1431     Chief Complaint  Patient presents with  . Otalgia   (Consider location/radiation/quality/duration/timing/severity/associated sxs/prior Treatment) HPI  She is a 25 year old woman here for evaluation of bilateral ear itching. A Burmese interpreter was present for this encounter. She states for the last week she has had itching in both of her ears. She also reports hearing a clicking or squeaking sound in her ears. She is concerned that there is something in her ear. She denies any pain or decreased hearing. No recent upper respiratory symptoms. No fevers. No vertigo. No problems like this in the past.  Past Medical History  Diagnosis Date  . Hepatitis     Hep B positive  . Hx: UTI (urinary tract infection)    Past Surgical History  Procedure Laterality Date  . No past surgeries     No family history on file. Social History  Substance Use Topics  . Smoking status: Never Smoker   . Smokeless tobacco: Never Used  . Alcohol Use: No   OB History    Gravida Para Term Preterm AB TAB SAB Ectopic Multiple Living   1 1 1  0 0 0 0 0 0 1     Review of Systems As in history of present illness Allergies  Review of patient's allergies indicates no known allergies.  Home Medications   Prior to Admission medications   Medication Sig Start Date End Date Taking? Authorizing Provider  ibuprofen (ADVIL,MOTRIN) 600 MG tablet Take 1 tablet (600 mg total) by mouth every 6 (six) hours as needed. 03/26/15   Tatyana Kirichenko, PA-C  neomycin-polymyxin-hydrocortisone (CORTISPORIN) 3.5-10000-1 otic suspension Place 4 drops into both ears 3 (three) times daily. For 1 week 12/25/15   Charm RingsErin J Jisel Fleet, MD  Prenat-FeCbn-FeAspGl-FA-Omega (OB COMPLETE PETITE) 35-5-1-200 MG CAPS Take 1 capsule by mouth daily. 07/25/14   Brock Badharles A Harper, MD  promethazine (PHENERGAN) 12.5 MG tablet Take 1 tablet  (12.5 mg total) by mouth every 6 (six) hours as needed for nausea or vomiting. 03/26/15   Jaynie Crumbleatyana Kirichenko, PA-C   Meds Ordered and Administered this Visit  Medications - No data to display  BP 106/78 mmHg  Pulse 68  Temp(Src) 98.2 F (36.8 C) (Oral)  Resp 16  SpO2 100%  LMP 12/07/2015 No data found.   Physical Exam  Constitutional: She is oriented to person, place, and time. She appears well-developed and well-nourished. No distress.  HENT:  Bilateral ear canals and TMs are normal.  Cardiovascular: Normal rate.   Pulmonary/Chest: Effort normal.  Neurological: She is alert and oriented to person, place, and time.    ED Course  Procedures (including critical care time)  Labs Review Labs Reviewed - No data to display  Imaging Review No results found.    MDM   1. Ear itching   2. Tinnitus aurium, bilateral    Suspect allergies versus contact irritant from using Q-tips in her ears. We'll treat with Cortisporin drops for 1 week. If no improvement, follow-up with ENT.    Charm RingsErin J Jamiaya Bina, MD 12/25/15 [phone]

## 2015-12-25 NOTE — ED Notes (Signed)
Via Burmese interpreter C/o bilateral ear pain onset x7 days associated w/ringing... Voices no other concerns  A&O x4... No acute distress.

## 2015-12-25 NOTE — Discharge Instructions (Signed)
There is no sign of infection in your ears. Your ear canals and eardrums are normal. You may have some allergies causing your symptoms.  The Q-tips could also be irritating the ear canal. Please use the eardrops 3 times a day for 1 week. If things are not improving in the next week, follow-up with Dr. Lazarus SalinesWolicki, the ENT specialist.

## 2017-05-05 ENCOUNTER — Other Ambulatory Visit (HOSPITAL_COMMUNITY)
Admission: RE | Admit: 2017-05-05 | Discharge: 2017-05-05 | Disposition: A | Discharge status: Home / Self Care | Payer: Medicaid Other | Source: Ambulatory Visit | Attending: Family Medicine | Admitting: Family Medicine

## 2017-07-05 ENCOUNTER — Emergency Department (HOSPITAL_COMMUNITY)
Admission: EM | Admit: 2017-07-05 | Discharge: 2017-07-05 | Disposition: A | Discharge status: Home / Self Care | Payer: Medicaid Other | Attending: Emergency Medicine | Admitting: Emergency Medicine

## 2017-07-05 ENCOUNTER — Encounter (HOSPITAL_COMMUNITY): Payer: Self-pay | Admitting: Emergency Medicine

## 2017-07-05 ENCOUNTER — Emergency Department (HOSPITAL_COMMUNITY): Payer: Medicaid Other

## 2017-07-05 DIAGNOSIS — R51 Headache: Secondary | ICD-10-CM | POA: Diagnosis not present

## 2017-07-05 DIAGNOSIS — H6690 Otitis media, unspecified, unspecified ear: Secondary | ICD-10-CM

## 2017-07-05 DIAGNOSIS — H60502 Unspecified acute noninfective otitis externa, left ear: Secondary | ICD-10-CM | POA: Diagnosis not present

## 2017-07-05 DIAGNOSIS — B169 Acute hepatitis B without delta-agent and without hepatic coma: Secondary | ICD-10-CM | POA: Diagnosis not present

## 2017-07-05 DIAGNOSIS — H9202 Otalgia, left ear: Secondary | ICD-10-CM | POA: Diagnosis present

## 2017-07-05 DIAGNOSIS — H6692 Otitis media, unspecified, left ear: Secondary | ICD-10-CM | POA: Insufficient documentation

## 2017-07-05 MED ORDER — AMOXICILLIN-POT CLAVULANATE 875-125 MG PO TABS: 1.0000 | ORAL_TABLET | Freq: Two times a day (BID) | ORAL | 0 refills | Status: DC

## 2017-07-05 MED ORDER — CIPROFLOXACIN-DEXAMETHASONE 0.3-0.1 % OT SUSP: 4.0000 [drp] | Freq: Two times a day (BID) | OTIC | 0 refills | Status: DC

## 2017-07-05 MED ORDER — IBUPROFEN 800 MG PO TABS
800.0000 mg | ORAL_TABLET | Freq: Once | ORAL | Status: AC
  Administered 2017-07-05: 800 mg via ORAL
  Filled 2017-07-05: qty 1

## 2017-07-05 NOTE — ED Notes (Signed)
C/o left ear pain since yesterday

## 2017-07-05 NOTE — ED Notes (Signed)
Patient transported to CT 

## 2017-07-05 NOTE — Discharge Instructions (Signed)
Please take all of your medication as directed.  Contact a doctor if: You have otitis media only in one ear, or bleeding from your nose, or both. You notice a lump on your neck. You are not getting better in 3-5 days. You feel worse instead of better. Get help right away if: You have pain that is not helped with medicine. You have puffiness, redness, or pain around your ear. You get a stiff neck. You cannot move part of your face (paralysis). You notice that the bone behind your ear hurts when you touch it.

## 2017-07-05 NOTE — ED Triage Notes (Signed)
C/o left ear pain, no drainage, c/o "itching and a bump" near ear-- gland behind ear slightly swollen

## 2017-07-05 NOTE — ED Provider Notes (Signed)
[facility]-EMERGENCY DEPT Provider Note   CSN: [id number] Arrival date & time: 07/05/17  4098     History   Chief Complaint Chief Complaint  Patient presents with  . Otalgia    HPI Joan Sawyer is a 26 y.o. Burmese female who presents to the emergency department with chief complaint of left ear pain. She has a past medical history of hepatitis B. There is a language barrier and translation services were utilized. The patient states that yesterday she began having pain in the left ear. Prior to that she had itching for several days. She denies any drainage. She complains of a headache which is global and throbbing and worse toward the left ear. She denies photophobia, phonophobia, and neck stiffness or rash.  HPI  Past Medical History:  Diagnosis Date  . Hepatitis    Hep B positive  . Hx: UTI (urinary tract infection)     Patient Active Problem List   Diagnosis Date Noted  . Active labor 10/17/2014  . Encounter for supervision of normal first pregnancy in second trimester 07/06/2014  . 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 Urinary tract infection, site not specified 06/29/2014    Past Surgical History:  Procedure Laterality Date  . NO PAST SURGERIES      OB History    Gravida Para Term Preterm AB Living   0 0 1   SAB TAB Ectopic Multiple Live Births   0 0 0 0 1       Home Medications    Prior to Admission medications   Medication Sig Start Date End Date Taking? Authorizing Provider  amoxicillin-clavulanate (AUGMENTIN) 875-125 MG tablet Take 1 tablet by mouth 2 (two) times daily. One po bid x 7 days 07/05/17   Arthor Captain, PA-C  ciprofloxacin-dexamethasone (CIPRODEX) OTIC suspension Place 4 drops into the left ear 2 (two) times daily. For 7 days 07/05/17   Arthor Captain, PA-C  ibuprofen (ADVIL,MOTRIN) 600 MG tablet Take 1 tablet (600 mg total) by mouth every 6 (six) hours as needed. 03/26/15   Kirichenko, Lemont Fillers, PA-C    neomycin-polymyxin-hydrocortisone (CORTISPORIN) 3.5-10000-1 otic suspension Place 4 drops into both ears 3 (three) times daily. For 1 week 12/25/15   Charm Rings, MD  Prenat-FeCbn-FeAspGl-FA-Omega (OB COMPLETE PETITE) 35-5-1-200 MG CAPS Take 1 capsule by mouth daily. 07/25/14   Brock Bad, MD  promethazine (PHENERGAN) 12.5 MG tablet Take 1 tablet (12.5 mg total) by mouth every 6 (six) hours as needed for nausea or vomiting. 03/26/15   Jaynie Crumble, PA-C    Family History No family history on file.  Social History Social History  Substance Use Topics  . Smoking status: Never Smoker  . Smokeless tobacco: Never Used  . Alcohol use No     Allergies   Patient has no known allergies.   Review of Systems Review of Systems  Ten systems reviewed and are negative for acute change, except as noted in the HPI.   Physical Exam Updated Vital Signs BP 122/87 (BP Location: Right Arm)   Pulse 97   Temp 98.1 F (36.7 C) (Oral)   Resp 20   LMP 07/05/2017   SpO2 100%   Physical Exam  Constitutional: She is oriented to person, place, and time. She appears well-developed and well-nourished. No distress.  HENT:  Head: Normocephalic and atraumatic.  Mouth/Throat: Oropharynx is clear and moist.  Right TM normal, no tenderness over the pinna There is darkening and tenderness over the left mastoid,  pain with movement of the auricle. There is some swelling in the canal and this makes visualization of the left TM difficult however there appears to be an opacified drums suggestive of purulence behind the tm  Eyes: Pupils are equal, round, and reactive to light. Conjunctivae and EOM are normal. No scleral icterus.  No horizontal, vertical or rotational nystagmus  Neck: Normal range of motion. Neck supple.  Full active and passive ROM without pain No midline or paraspinal tenderness No nuchal rigidity or meningeal signs  Cardiovascular: Normal rate, regular rhythm, normal heart  sounds and intact distal pulses.  Exam reveals no gallop and no friction rub.   No murmur heard. Pulmonary/Chest: Effort normal and breath sounds normal. No respiratory distress. She has no wheezes. She has no rales.  Abdominal: Soft. Bowel sounds are normal. She exhibits no distension and no mass. There is no tenderness. There is no rebound and no guarding.  Musculoskeletal: Normal range of motion.  Lymphadenopathy:    She has no cervical adenopathy.  Neurological: She is alert and oriented to person, place, and time. No cranial nerve deficit. She exhibits normal muscle tone. Coordination normal.  Mental Status:  Alert, oriented, thought content appropriate. Speech fluent without evidence of aphasia. Able to follow 2 step commands without difficulty.  Cranial Nerves:  II:  Peripheral visual fields grossly normal, pupils equal, round, reactive to light III,IV, VI: ptosis not present, extra-ocular motions intact bilaterally  V,VII: smile symmetric, facial light touch sensation equal VIII: hearing grossly normal bilaterally  IX,X: midline uvula rise  XI: bilateral shoulder shrug equal and strong XII: midline tongue extension  Motor:  5/5 in upper and lower extremities bilaterally including strong and equal grip strength and dorsiflexion/plantar flexion Sensory: Pinprick and light touch normal in all extremities.  Cerebellar: normal finger-to-nose with bilateral upper extremities Gait: normal gait and balance CV: distal pulses palpable throughout   Skin: Skin is warm and dry. No rash noted. She is not diaphoretic.  Psychiatric: She has a normal mood and affect. Her behavior is normal. Judgment and thought content normal.  Nursing note and vitals reviewed.    ED Treatments / Results  Labs (all labs ordered are listed, but only abnormal results are displayed) Labs Reviewed - No data to display  EKG  EKG Interpretation None       Radiology Ct Head Wo Contrast  Result Date:  07/05/2017 CLINICAL DATA:  Left-sided headaches and ear pain EXAM: CT HEAD WITHOUT CONTRAST TECHNIQUE: Contiguous axial images were obtained from the base of the skull through the vertex without intravenous contrast. COMPARISON:  None. FINDINGS: Brain: No evidence of acute infarction, hemorrhage, hydrocephalus, extra-axial collection or mass lesion/mass effect. Vascular: No hyperdense vessel or unexpected calcification. Skull: Normal. Negative for fracture or focal lesion. Sinuses/Orbits: No acute finding. Other: Mastoids are well aerated without fluid. IMPRESSION: No acute abnormality noted. Electronically Signed   By: Alcide Clever M.D.   On: 07/05/2017 12:27    Procedures Procedures (including critical care time)  Medications Ordered in ED Medications  ibuprofen (ADVIL,MOTRIN) tablet 800 mg (800 mg Oral Given 07/05/17 1127)     Initial Impression / Assessment and Plan / ED Course  I have reviewed the triage vital signs and the nursing notes.  Pertinent labs & imaging results that were available during my care of the patient were reviewed by me and considered in my medical decision making (see chart for details).     Patient without any evidence of mastoiditis. Will treat  with antibiotics for both external and otitis media. Patient is to follow-up with ear nose and throat if symptoms do not resolve or worsen. Discussed return precautions. She appears appropriate for discharge at this time  Final Clinical Impressions(s) / ED Diagnoses   Final diagnoses:  Acute otitis externa of left ear, unspecified type  Acute otitis media, unspecified otitis media type    New Prescriptions New Prescriptions   AMOXICILLIN-CLAVULANATE (AUGMENTIN) 875-125 MG TABLET    Take 1 tablet by mouth 2 (two) times daily. One po bid x 7 days   CIPROFLOXACIN-DEXAMETHASONE (CIPRODEX) OTIC SUSPENSION    Place 4 drops into the left ear 2 (two) times daily. For 7 days     Delos Haring 07/05/17 1252     Benjiman Core, MD 07/05/17 [phone]

## 2017-10-14 NOTE — L&D Delivery Note (Signed)
Patient: Joan Sawyer MRN: [id number]  GBS status: none, IAP: not indicated  Patient is a 27 y.o. now G2P2 s/p NSVD at 7539w1d, who was admitted for SOL. AROM 0h 529m prior to delivery with clear fluid.  Head delivered LOA. No nuchal cord present. Shoulder and body delivered in usual fashion. Infant with spontaneous cry, placed on mother's abdomen, dried and bulb suctioned. Cord clamped x 2 after 1-minute delay, and cut by family member. Cord blood drawn. Placenta delivered spontaneously with gentle cord traction. Fundus firm with massage and Pitocin. Perineum inspected and found to have 2nd degreee perineal laceration, which was repaired with 3.0 vicryl with good hemostasis achieved.  Delivery Note At 1:19 AM a viable female was delivered via Vaginal, Spontaneous  APGAR: 9, 9; weight: pending (appears AGA)  .   Placenta status: intact, 3-vessel cord.  Cord:  with the following complications: none.    Anesthesia:  None Episiotomy: None Lacerations: 2nd degree;Perineal Suture Repair: 3.0 vicryl Est. Blood Loss (mL): 200  Mom to postpartum.  Baby to Couplet care / Skin to Skin.  Gwenevere AbbotNimeka Treylen Gibbs 09/23/2018, 1:44 AM

## 2018-01-30 IMAGING — CT CT HEAD W/O CM
4 series · 17 of 47 positions shown, 19 images · non-contrast
Comparison: None.

CLINICAL DATA: Left-sided headaches and ear pain

EXAM:
CT HEAD WITHOUT CONTRAST
TECHNIQUE: Contiguous axial images were obtained from the base of the skull
through the vertex without intravenous contrast.

[Series 3: head wo · axial · 0.45mm/px · z∈[-82,+38]mm · 7 of 32 slices shown, 9 images]
[im 4/32  brain]
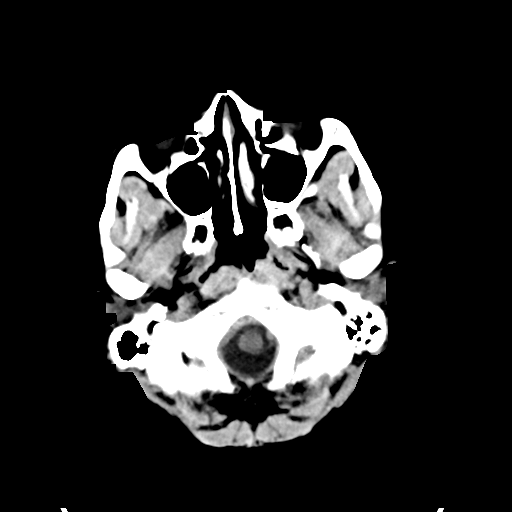
[im 4/32  bone]
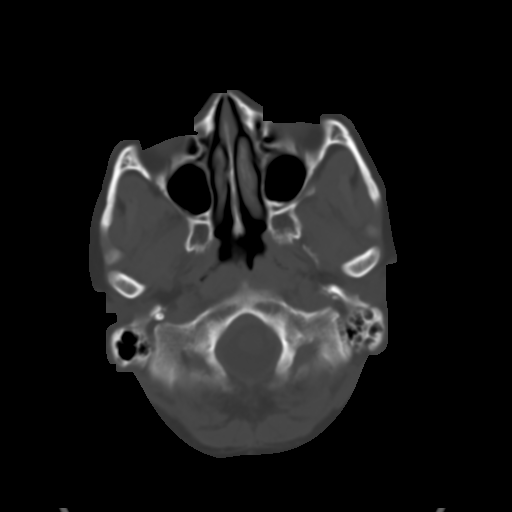
[im 8/32  brain]
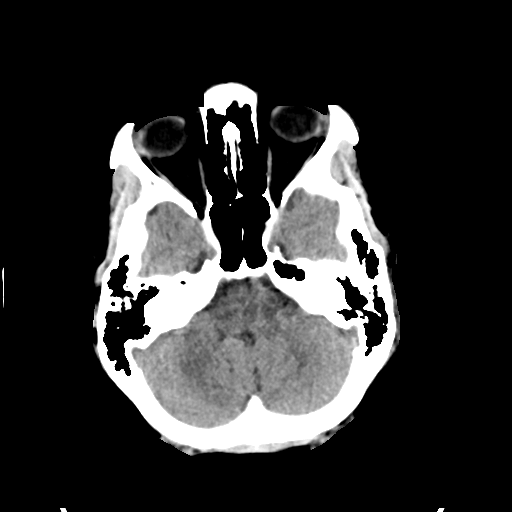
[im 12/32  brain]
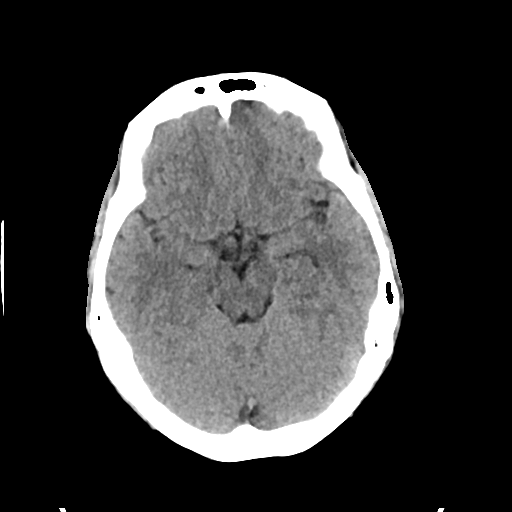
[im 16/32  brain]
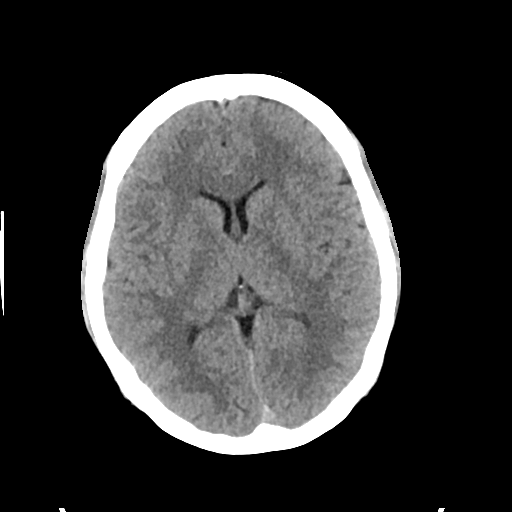
[im 20/32  brain]
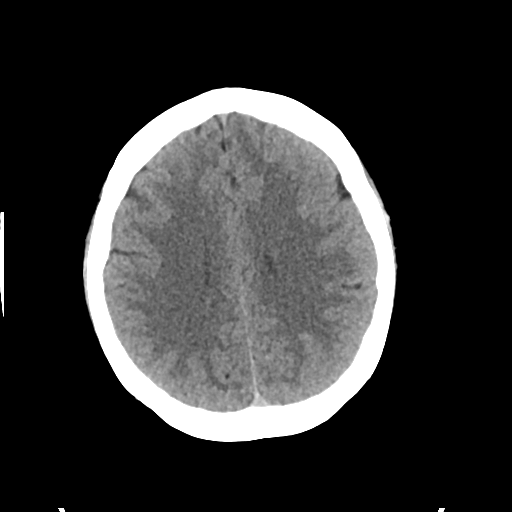
[im 20/32  bone]
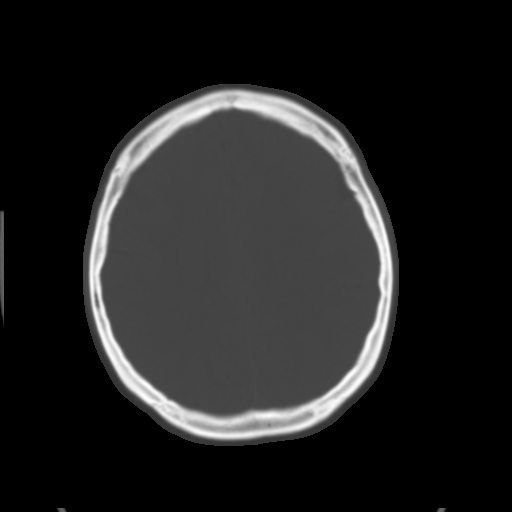
[im 24/32  brain]
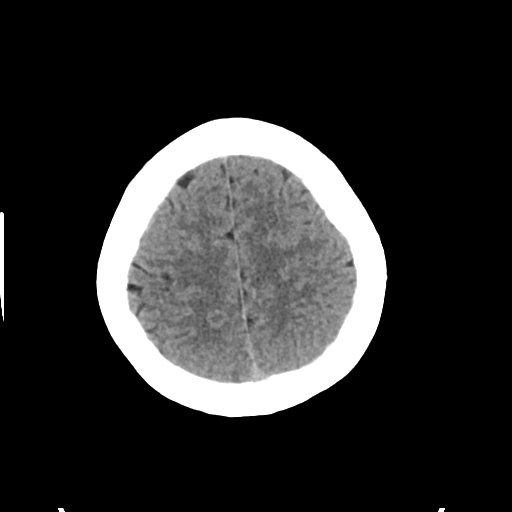
[im 28/32  brain]
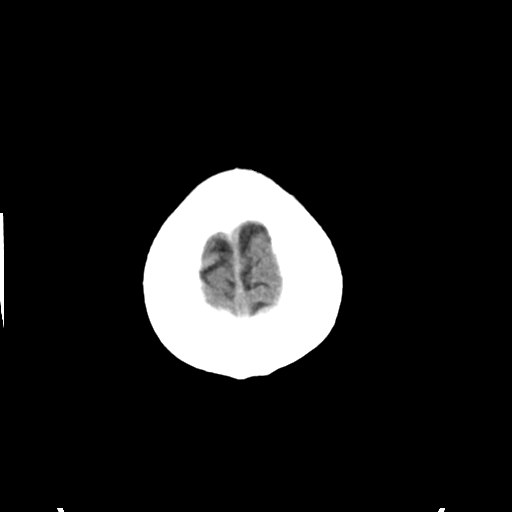

[Series 4: head bone · axial · 0.45mm/px · z∈[-82,-26]mm · 4 of 80 slices shown]
[im 8/80  bone]
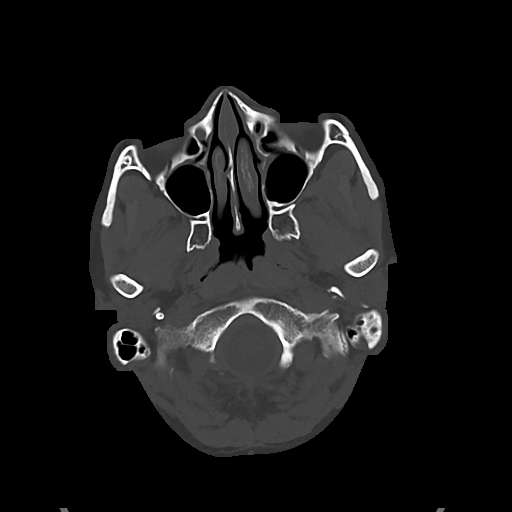
[im 16/80  bone]
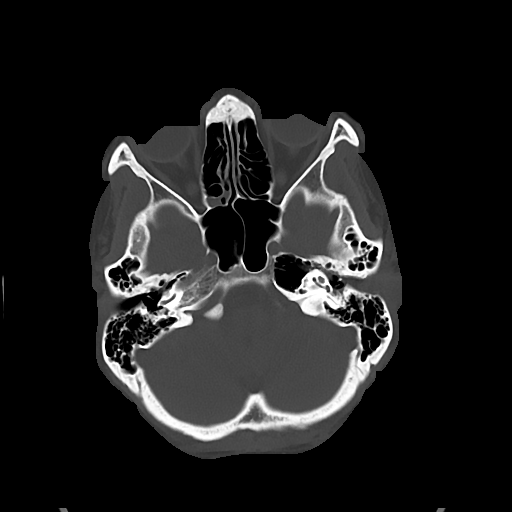
[im 24/80  bone]
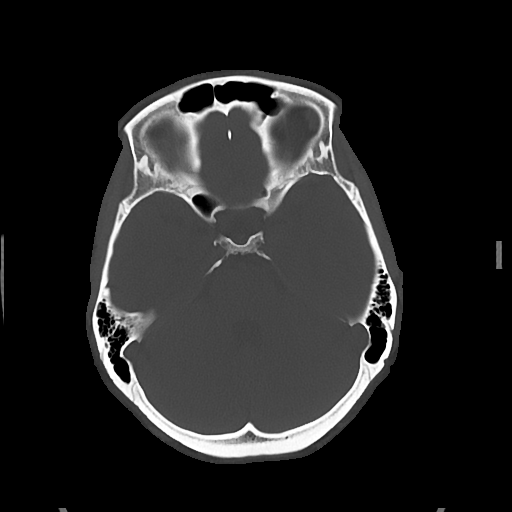
[im 36/80  bone]
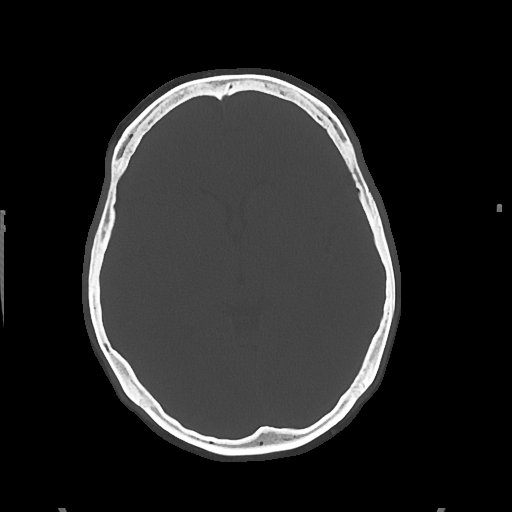

[Series 5: cor soft · coronal · 0.31mm/px · 3 of 67 slices shown]
[im 23/67  brain]
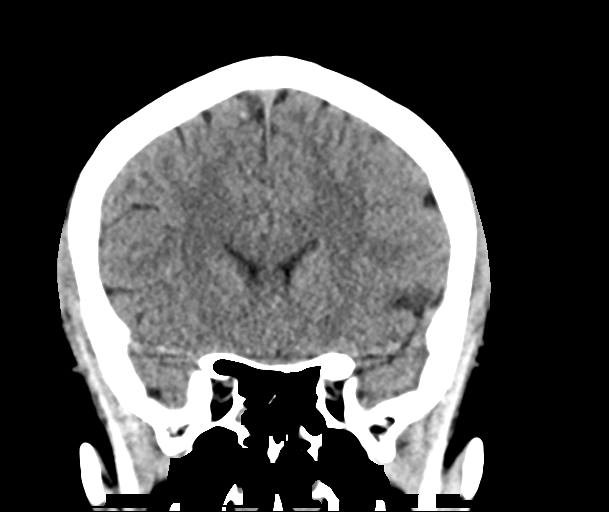
[im 30/67  brain]
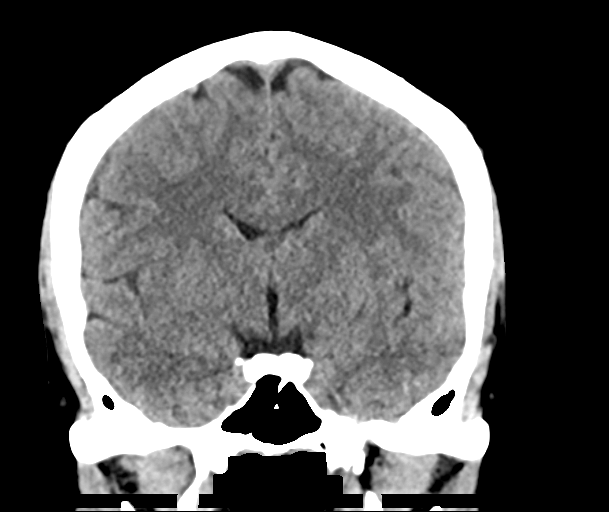
[im 37/67  brain]
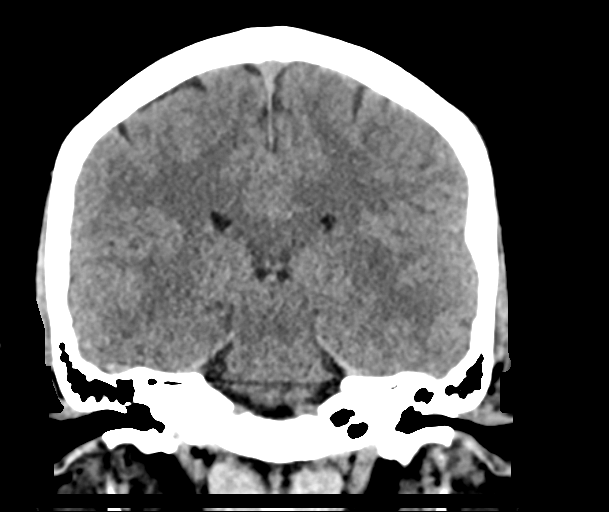

[Series 6: sag soft · sagittal · 0.35mm/px · 3 of 67 slices shown]
[im 23/67  brain]
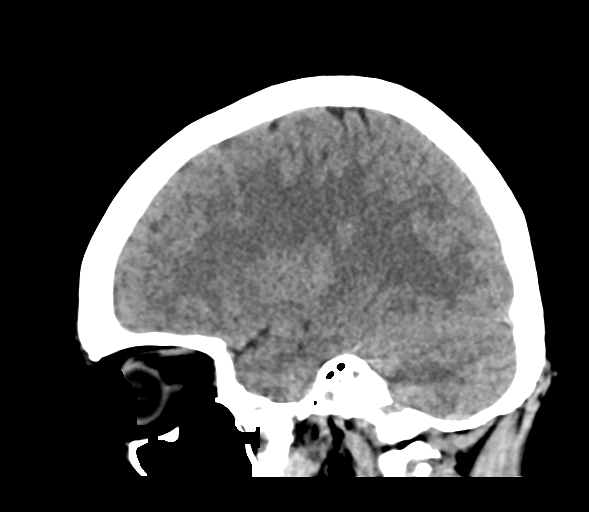
[im 34/67  brain]
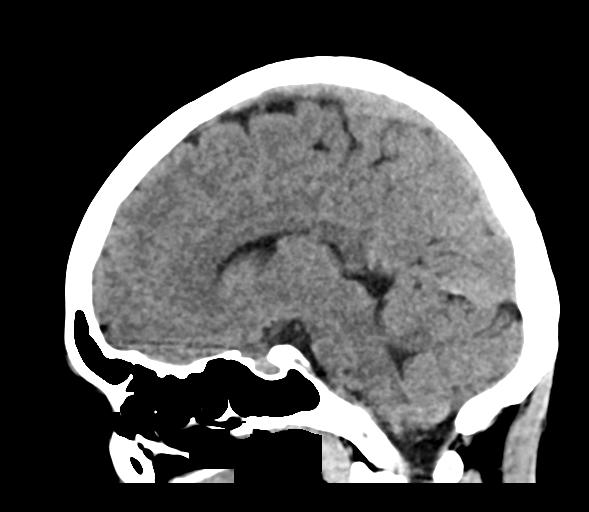
[im 45/67  brain]
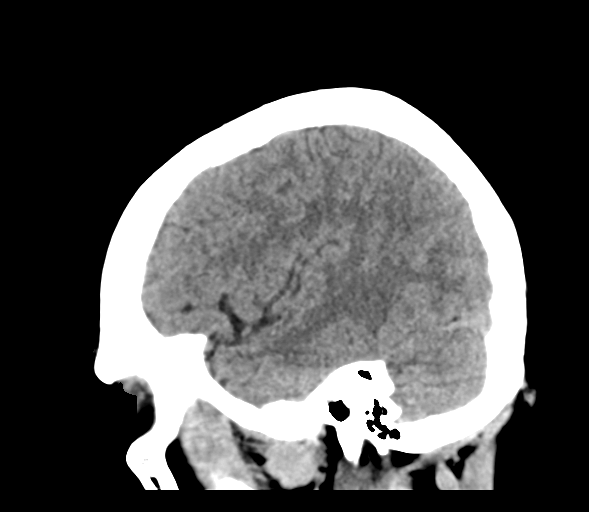

[17 of 47 positions shown; findings below may reference images not displayed]

FINDINGS: Brain: No evidence of acute infarction, hemorrhage, hydrocephalus,
extra-axial collection or mass lesion/mass effect.

Vascular: No hyperdense vessel or unexpected calcification.

Skull: Normal. Negative for fracture or focal lesion.

Sinuses/Orbits: No acute finding.

Other: Mastoids are well aerated without fluid.
IMPRESSION: No acute abnormality noted.

## 2018-02-25 ENCOUNTER — Encounter: Payer: Self-pay | Admitting: Obstetrics & Gynecology

## 2018-02-25 ENCOUNTER — Ambulatory Visit: Payer: Medicaid Other

## 2018-02-25 DIAGNOSIS — Z3201 Encounter for pregnancy test, result positive: Secondary | ICD-10-CM | POA: Diagnosis present

## 2018-02-25 LAB — POCT PREGNANCY, URINE: Preg Test, Ur: POSITIVE — AB

## 2018-02-25 NOTE — Progress Notes (Signed)
Pt here today for pregnancy test.  Resulted positive.  Pt denies any pain or bleeding.  Encouraged pt to start taking PNV.  Pt reports sure LMP 12/16/17. Pt given EDD 09/22/18 @ 10w 1d.  Pt given list of medications that is safe to take in pregnancy.  Front office to provide proof of pregnancy letter.

## 2018-02-26 NOTE — Progress Notes (Signed)
I have reviewed this chart and agree with the RN/CMA assessment and management.    Deronte Solis C Illa Enlow, MD, FACOG Attending Physician, [facility]   

## 2018-03-16 ENCOUNTER — Encounter: Payer: Self-pay | Admitting: *Deleted

## 2018-03-16 ENCOUNTER — Other Ambulatory Visit (HOSPITAL_COMMUNITY)
Admission: RE | Admit: 2018-03-16 | Discharge: 2018-03-16 | Disposition: A | Discharge status: Home / Self Care | Payer: Medicaid Other | Source: Ambulatory Visit | Attending: Advanced Practice Midwife | Admitting: Advanced Practice Midwife

## 2018-03-16 ENCOUNTER — Ambulatory Visit (INDEPENDENT_AMBULATORY_CARE_PROVIDER_SITE_OTHER): Payer: Medicaid Other | Admitting: Advanced Practice Midwife

## 2018-03-16 ENCOUNTER — Encounter: Payer: Self-pay | Admitting: Advanced Practice Midwife

## 2018-03-16 DIAGNOSIS — Z3480 Encounter for supervision of other normal pregnancy, unspecified trimester: Secondary | ICD-10-CM | POA: Insufficient documentation

## 2018-03-16 DIAGNOSIS — Z3A12 12 weeks gestation of pregnancy: Secondary | ICD-10-CM | POA: Diagnosis not present

## 2018-03-16 DIAGNOSIS — Z3481 Encounter for supervision of other normal pregnancy, first trimester: Secondary | ICD-10-CM | POA: Insufficient documentation

## 2018-03-16 LAB — POCT URINALYSIS DIP (DEVICE)
GLUCOSE, UA: NEGATIVE mg/dL
Hgb urine dipstick: NEGATIVE
NITRITE: NEGATIVE
PH: 6.5 (ref 5.0–8.0)
PROTEIN: 30 mg/dL — AB
Specific Gravity, Urine: 1.015 (ref 1.005–1.030)
Urobilinogen, UA: 1 mg/dL (ref 0.0–1.0)

## 2018-03-16 MED ORDER — PRENATAL 27-0.8 MG PO TABS: 1.0000 | ORAL_TABLET | Freq: Every day | ORAL | 12 refills | Status: AC

## 2018-03-16 NOTE — Progress Notes (Signed)
Here for initial prenatal visit. States she fell at [redacted] weeks pregnant and worried about baby. C/o lower back pain. Discussed using tylenol as needed and maternity support belt. Not candidate for [employer]. Declines MyChart.

## 2018-03-16 NOTE — Progress Notes (Signed)
PMH form completed.

## 2018-03-16 NOTE — Progress Notes (Signed)
Subjective:   Joan Sawyer is a 27 y.o. G2P1001 at 5176w6d by LMP being seen today for her first obstetrical visit.  Her obstetrical history is significant for none. Patient does intend to breast feed. Pregnancy history fully reviewed.  Patient reports fatigue, heartburn and nausea.  HISTORY: OB History  Gravida Para Term Preterm AB Living  2 1 1  0 0 1  SAB TAB Ectopic Multiple Live Births  0 0 0 0 1    # Outcome Date GA Lbr Len/2nd Weight Sex Delivery Anes PTL Lv  2 Current           1 Term 10/17/14 6563w2d 11:06 / 01:09 9 lb 8.7 oz (4.329 kg) M Vag-Spont None  LIV     Apgar1: 8  Apgar5: 9    Last pap smear was done 06/2014 and was normal  Past Medical History:  Diagnosis Date  . Hepatitis    Hep B positive  . Hx: UTI (urinary tract infection)    Past Surgical History:  Procedure Laterality Date  . NO PAST SURGERIES     History reviewed. No pertinent family history. Social History   Tobacco Use  . Smoking status: Never Smoker  . Smokeless tobacco: Never Used  Substance Use Topics  . Alcohol use: No  . Drug use: No   No Known Allergies No current outpatient medications on file prior to visit.   No current facility-administered medications on file prior to visit.     Review of Systems Pertinent items noted in HPI and remainder of comprehensive ROS otherwise negative.  Exam   Vitals:   03/16/18 1548  Weight: 154 lb 3.2 oz (69.9 kg)   Fetal Heart Rate (bpm): 164  Uterus:   12 weeks size, FHT 164 with doppler   Pelvic Exam: Perineum: no hemorrhoids, normal perineum   Vulva: normal external genitalia, no lesions   Vagina:  normal mucosa, normal discharge   Cervix: no lesions and normal, pap smear done.    Adnexa: normal adnexa and no mass, fullness, tenderness   Bony Pelvis: average  System: General: well-developed, well-nourished female in no acute distress   Breast:  normal appearance, no masses or tenderness   Skin: normal coloration and turgor, no rashes   Neurologic: oriented, normal, negative, normal mood   Extremities: normal strength, tone, and muscle mass, ROM of all joints is normal   HEENT PERRLA, extraocular movement intact and sclera clear, anicteric   Mouth/Teeth mucous membranes moist, pharynx normal without lesions and dental hygiene good   Neck supple and no masses   Cardiovascular: regular rate and rhythm   Respiratory:  no respiratory distress, normal breath sounds   Abdomen: soft, non-tender; bowel sounds normal; no masses,  no organomegaly    Assessment:   Pregnancy: G2P1001 Patient Active Problem List   Diagnosis Date Noted  . Supervision of other normal pregnancy, antepartum 03/16/2018  . Hepatitis B surface antigen positive 07/05/2014     Plan:  1. Supervision of other normal pregnancy, antepartum  Initial labs drawn. Continue prenatal vitamins. Genetic Screening discussed, First trimester screen, Quad screen and NIPS: requested. Ultrasound discussed; fetal anatomic survey: requested. Problem list reviewed and updated. The nature of [facility] [place] [facility] with multiple MDs and other Advanced Practice Providers was explained to patient; also emphasized that residents, students are part of our team. Routine obstetric precautions reviewed. 50% of 45 min visit spent in counseling and coordination of care. Return in about 1 month (around 04/13/2018).

## 2018-03-16 NOTE — Patient Instructions (Signed)

## 2018-03-17 LAB — CYTOLOGY - PAP
Chlamydia: NEGATIVE
Diagnosis: NEGATIVE
NEISSERIA GONORRHEA: NEGATIVE

## 2018-03-19 LAB — URINE CULTURE, OB REFLEX

## 2018-03-19 LAB — CULTURE, OB URINE

## 2018-03-21 LAB — OBSTETRIC PANEL, INCLUDING HIV
Antibody Screen: NEGATIVE
BASOS ABS: 0 10*3/uL (ref 0.0–0.2)
Basos: 0 %
EOS (ABSOLUTE): 0.1 10*3/uL (ref 0.0–0.4)
Eos: 1 %
HIV SCREEN 4TH GENERATION: NONREACTIVE
Hematocrit: 32.8 % — ABNORMAL LOW (ref 34.0–46.6)
Hemoglobin: 10.9 g/dL — ABNORMAL LOW (ref 11.1–15.9)
Immature Grans (Abs): 0 10*3/uL (ref 0.0–0.1)
Immature Granulocytes: 0 %
Lymphocytes Absolute: 1.2 10*3/uL (ref 0.7–3.1)
Lymphs: 25 %
MCH: 29.2 pg (ref 26.6–33.0)
MCHC: 33.2 g/dL (ref 31.5–35.7)
MCV: 88 fL (ref 79–97)
MONOCYTES: 5 %
Monocytes Absolute: 0.3 10*3/uL (ref 0.1–0.9)
NEUTROS ABS: 3.3 10*3/uL (ref 1.4–7.0)
NEUTROS PCT: 69 %
PLATELETS: 236 10*3/uL (ref 150–450)
RBC: 3.73 x10E6/uL — ABNORMAL LOW (ref 3.77–5.28)
RDW: 15 % (ref 12.3–15.4)
RPR: NONREACTIVE
RUBELLA: 18.9 {index} (ref >0.99)
Rh Factor: POSITIVE
WBC: 4.8 10*3/uL (ref 3.4–10.8)

## 2018-03-21 LAB — HEPATITIS B SURFACE AG, CONFIRM: HBsAg Confirmation: POSITIVE — AB

## 2018-03-21 LAB — SMN1 COPY NUMBER ANALYSIS (SMA CARRIER SCREENING)

## 2018-03-21 MED ORDER — CEPHALEXIN 500 MG PO CAPS: 500.0000 mg | ORAL_CAPSULE | Freq: Four times a day (QID) | ORAL | 0 refills | Status: DC

## 2018-03-21 NOTE — Addendum Note (Signed)
Addended by: Thressa ShellerHOGAN, HEATHER D on: 03/21/2018 09:50 AM   Modules accepted: Orders

## 2018-03-23 ENCOUNTER — Telehealth: Payer: Self-pay

## 2018-03-23 NOTE — Telephone Encounter (Signed)
Left message for patient to call office regarding UTI results and recommendations.

## 2018-03-25 ENCOUNTER — Encounter: Payer: Self-pay | Admitting: *Deleted

## 2018-04-13 ENCOUNTER — Ambulatory Visit (INDEPENDENT_AMBULATORY_CARE_PROVIDER_SITE_OTHER): Payer: Medicaid Other | Admitting: Advanced Practice Midwife

## 2018-04-13 ENCOUNTER — Encounter: Payer: Self-pay | Admitting: Advanced Practice Midwife

## 2018-04-13 ENCOUNTER — Telehealth: Payer: Self-pay

## 2018-04-13 ENCOUNTER — Other Ambulatory Visit: Payer: Self-pay

## 2018-04-13 VITALS — BP 94/62 | HR 89 | Wt 156.0 lb

## 2018-04-13 DIAGNOSIS — R768 Other specified abnormal immunological findings in serum: Secondary | ICD-10-CM

## 2018-04-13 DIAGNOSIS — Z3480 Encounter for supervision of other normal pregnancy, unspecified trimester: Secondary | ICD-10-CM

## 2018-04-13 NOTE — Telephone Encounter (Signed)
Left message with infectious disease clinic to contact patient regarding referral.  I called three times trying to speak with someone while patient was in the office to schedule appointment.

## 2018-04-13 NOTE — Patient Instructions (Signed)

## 2018-04-13 NOTE — Progress Notes (Signed)
   PRENATAL VISIT NOTE  Subjective:  Joan Sawyer is a 27 y.o. G2P1001 at 5261w6d being seen today for ongoing prenatal care.  She is currently monitored for the following issues for this high-risk pregnancy and has Hepatitis B surface antigen positive and Supervision of other normal pregnancy, antepartum on their problem list.  Patient reports no complaints.  Contractions: Not present. Vag. Bleeding: None.  Movement: Present. Denies leaking of fluid.   The following portions of the patient's history were reviewed and updated as appropriate: allergies, current medications, past family history, past medical history, past social history, past surgical history and problem list. Problem list updated.  Objective:   Vitals:   04/13/18 1120  BP: 94/62  Pulse: 89  Weight: 156 lb (70.8 kg)    Fetal Status: Fetal Heart Rate (bpm): 154   Movement: Present     General:  Alert, oriented and cooperative. Patient is in no acute distress.  Skin: Skin is warm and dry. No rash noted.   Cardiovascular: Normal heart rate noted  Respiratory: Normal respiratory effort, no problems with respiration noted  Abdomen: Soft, gravid, appropriate for gestational age.  Pain/Pressure: Present     Pelvic: Cervical exam deferred        Extremities: Normal range of motion.  Edema: None  Mental Status: Normal mood and affect. Normal behavior. Normal judgment and thought content.   Assessment and Plan:  Pregnancy: G2P1001 at 3761w6d  1. Supervision of other normal pregnancy, antepartum - Ambulatory referral to Infectious Disease - Patient declines AFP   Preterm labor symptoms and general obstetric precautions including but not limited to vaginal bleeding, contractions, leaking of fluid and fetal movement were reviewed in detail with the patient. Please refer to After Visit Summary for other counseling recommendations.  No follow-ups on file.  Future Appointments  Date Time Provider Department Center  04/27/2018  1:15  PM [facility] US 4 [facility]    Thressa ShellerHeather Hogan, CNM

## 2018-04-22 ENCOUNTER — Encounter (HOSPITAL_COMMUNITY): Payer: Self-pay

## 2018-04-27 ENCOUNTER — Ambulatory Visit (HOSPITAL_COMMUNITY)
Admission: RE | Admit: 2018-04-27 | Discharge: 2018-04-27 | Disposition: A | Discharge status: Home / Self Care | Payer: Medicaid Other | Source: Ambulatory Visit | Attending: Advanced Practice Midwife | Admitting: Advanced Practice Midwife

## 2018-04-27 DIAGNOSIS — Z3A18 18 weeks gestation of pregnancy: Secondary | ICD-10-CM

## 2018-04-27 DIAGNOSIS — Z363 Encounter for antenatal screening for malformations: Secondary | ICD-10-CM

## 2018-04-27 DIAGNOSIS — B1710 Acute hepatitis C without hepatic coma: Secondary | ICD-10-CM | POA: Diagnosis not present

## 2018-04-27 DIAGNOSIS — Z3480 Encounter for supervision of other normal pregnancy, unspecified trimester: Secondary | ICD-10-CM

## 2018-04-27 DIAGNOSIS — O98412 Viral hepatitis complicating pregnancy, second trimester: Secondary | ICD-10-CM | POA: Insufficient documentation

## 2018-04-27 DIAGNOSIS — Z3689 Encounter for other specified antenatal screening: Secondary | ICD-10-CM | POA: Diagnosis present

## 2018-04-28 ENCOUNTER — Other Ambulatory Visit (HOSPITAL_COMMUNITY): Payer: Medicaid Other

## 2018-05-12 ENCOUNTER — Ambulatory Visit (INDEPENDENT_AMBULATORY_CARE_PROVIDER_SITE_OTHER): Payer: Medicaid Other | Admitting: Student

## 2018-05-12 VITALS — BP 100/73 | HR 82 | Wt 155.8 lb

## 2018-05-12 DIAGNOSIS — R768 Other specified abnormal immunological findings in serum: Secondary | ICD-10-CM

## 2018-05-12 DIAGNOSIS — Z3480 Encounter for supervision of other normal pregnancy, unspecified trimester: Secondary | ICD-10-CM

## 2018-05-12 NOTE — Addendum Note (Signed)
Addended by: Chrystie NoseKOOISTRA, KATHRYN L on: 05/12/2018 08:59 AM   Modules accepted: Orders

## 2018-05-12 NOTE — Patient Instructions (Signed)

## 2018-05-12 NOTE — Progress Notes (Addendum)
   PRENATAL VISIT NOTE  Subjective:  Joan Sawyer is a 27 y.o. G2P1001 at 2940w0d being seen today for ongoing prenatal care.  She is currently monitored for the following issues for this low-risk pregnancy and has Hepatitis B surface antigen positive and Supervision of other normal pregnancy, antepartum on their problem list.  Patient reports no complaints other than occasional leg pain.  Contractions: Not present. Vag. Bleeding: None.  Movement: Present. Denies leaking of fluid.   The following portions of the patient's history were reviewed and updated as appropriate: allergies, current medications, past family history, past medical history, past social history, past surgical history and problem list. Problem list updated.  Objective:   Vitals:   05/12/18 0845  BP: 100/73  Pulse: 82  Weight: 155 lb 12.8 oz (70.7 kg)    Fetal Status: Fetal Heart Rate (bpm): 156 Fundal Height: 21 cm Movement: Present     General:  Alert, oriented and cooperative. Patient is in no acute distress.  Skin: Skin is warm and dry. No rash noted.   Cardiovascular: Normal heart rate noted  Respiratory: Normal respiratory effort, no problems with respiration noted  Abdomen: Soft, gravid, appropriate for gestational age.  Pain/Pressure: Absent     Pelvic: Cervical exam deferred        Extremities: Normal range of motion.  Edema: None  Mental Status: Normal mood and affect. Normal behavior. Normal judgment and thought content.   Assessment and Plan:  Pregnancy: G2P1001 at 1240w0d  1. Supervision of other normal pregnancy, antepartum  2. Hep B SAg positive-has infectious disease appt on Thursday; will also draw hepB quantitative level today.    Preterm labor symptoms and general obstetric precautions including but not limited to vaginal bleeding, contractions, leaking of fluid and fetal movement were reviewed in detail with the patient. Please refer to After Visit Summary for other counseling recommendations.    Return in about 1 month (around 06/09/2018), or LROB.  Future Appointments  Date Time Provider Department Center  05/14/2018  3:45 PM Blanchard Kelchixon, Stephanie N, NP [facility]    Charlesetta GaribaldiKathryn Lorraine SuffieldKooistra, [place]

## 2018-05-13 LAB — HEPATITIS B SURFACE ANTIBODY, QUANTITATIVE: HEPATITIS B SURF AB QUANT: 12.7 m[IU]/mL

## 2018-05-14 ENCOUNTER — Ambulatory Visit (INDEPENDENT_AMBULATORY_CARE_PROVIDER_SITE_OTHER): Payer: Medicaid Other | Admitting: Infectious Diseases

## 2018-05-14 ENCOUNTER — Encounter: Payer: Self-pay | Admitting: Infectious Diseases

## 2018-05-14 VITALS — BP 98/68 | HR 81 | Temp 97.8°F | Wt 159.6 lb

## 2018-05-14 DIAGNOSIS — R768 Other specified abnormal immunological findings in serum: Secondary | ICD-10-CM | POA: Diagnosis present

## 2018-05-14 NOTE — Addendum Note (Signed)
Addended by: Lurlean LeydenPOOLE, TRAVIS F on: 05/14/2018 04:23 PM   Modules accepted: Orders

## 2018-05-14 NOTE — Assessment & Plan Note (Signed)
New patient with (+) Hep B surface antigen here today 2917w2d gestation. Will check Hep B DNA, eAg, CMET, Fibrosis panel today to assess need for treatment. 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 If her DNA is very high will plan on starting her on once daily tenofovir DF. She would like to come back for in person discussion in 3 weeks to determine next steps for treatment/monitoring.

## 2018-05-14 NOTE — Progress Notes (Signed)
Patient: Joan Sawyer  DOB: 1991-07-06 MRN: [id number] PCP: Patient, No Pcp Per  Referring Provider: OB  Patient Active Problem List   Diagnosis Date Noted  . Supervision of other normal pregnancy, antepartum 03/16/2018  . Hepatitis B surface antigen positive 07/05/2014    Chief Complaint  Patient presents with  . New Patient (Initial Visit)    hep b care     Subjective:  Joan Sawyer is a 27 y.o. Burmese speaking female here for evaluation of Hep B lab work discovered during pregnancy. An interpreter Khim was present during the visit to facilitate.   Her hepatitis b is not new information; was told that she was positive during previous pregnancy 4 years ago with her son. He does not have it currently and he has been vaccinated. Prior to this she was never tested. She does not think her mother or other siblings have the infection. Her husband/FOB has been tested and negative. She tells me that she was on medication before during pregnancy but nothing since that time. When she stopped the medication she did not experience any acute illness but tells me she only made it through 1 month and stopped due to what sounds to be related to nausea. She denies prolonged/easy bleeding, yellow eyes/skin, abdominal pain, changes to her urine or any new rashes. She is presently due with her second child (boy) in December of this year.   Review of Systems  Constitutional: Negative for chills and fever.  HENT: Negative for tinnitus.   Eyes: Negative for blurred vision and photophobia.  Respiratory: Negative for cough and sputum production.   Cardiovascular: Negative for chest pain.  Gastrointestinal: Negative for blood in stool, diarrhea, nausea and vomiting.  Genitourinary: Negative for dysuria.  Skin: Negative for rash.  Neurological: Negative for headaches.  Endo/Heme/Allergies: Does not bruise/bleed easily.    Past Medical History:  Diagnosis Date  . Hepatitis    Hep B positive  . Hepatitis B     . Hx: UTI (urinary tract infection)     Outpatient Medications Prior to Visit  Medication Sig Dispense Refill  . Prenatal Vit-Fe Fumarate-FA (MULTIVITAMIN-PRENATAL) 27-0.8 MG TABS tablet Take 1 tablet by mouth daily at 12 noon. 30 each 12   No facility-administered medications prior to visit.      No Known Allergies  Social History   Tobacco Use  . Smoking status: Never Smoker  . Smokeless tobacco: Never Used  Substance Use Topics  . Alcohol use: No  . Drug use: No    Family History  Problem Relation Age of Onset  . Healthy Mother   . Healthy Father   . Asthma Brother   . Liver disease Neg Hx     Objective:   Vitals:   05/14/18 1548  BP: 98/68  Pulse: 81  Temp: 97.8 F (36.6 C)  Weight: 159 lb 9.6 oz (72.4 kg)   Body mass index is 26.56 kg/m.  Physical Exam  Constitutional: She is oriented to person, place, and time. She appears well-developed and well-nourished.  Seated comfortably in chair today and appears well.   Eyes: No scleral icterus.  Neck: No JVD present.  Cardiovascular: Normal rate and regular rhythm.  Pulmonary/Chest: Effort normal. No respiratory distress.  Abdominal: Bowel sounds are normal. There is no tenderness.  Pregnant with uterus at the level of umbilicus   Musculoskeletal: Normal range of motion.  Lymphadenopathy:    She has no cervical adenopathy.  Neurological: She is alert and  oriented to person, place, and time.  Skin: Skin is warm and dry. No rash noted. No pallor.  Psychiatric: She has a normal mood and affect. Judgment normal.    Lab Results: Lab Results  Component Value Date   WBC 4.8 03/16/2018   HGB 10.9 (L) 03/16/2018   HCT 32.8 (L) 03/16/2018   MCV 88 03/16/2018   PLT 236 03/16/2018    Lab Results  Component Value Date   CREATININE 0.78 03/26/2015   BUN 8 03/26/2015   NA 132 (L) 03/26/2015   K 3.3 (L) 03/26/2015   CL 101 03/26/2015   CO2 20 (L) 03/26/2015    Lab Results  Component Value Date   ALT 43  03/26/2015   AST 26 03/26/2015   ALKPHOS 55 03/26/2015   BILITOT 0.8 03/26/2015     Assessment & Plan:   Problem List Items Addressed This Visit      Other   Hepatitis B surface antigen positive - Primary    New patient with (+) Hep B surface antigen here today 4132w2d gestation. Will check Hep B DNA, eAg, CMET, Fibrosis panel today to assess need for treatment. 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 If her DNA is very high will plan on starting her on once daily tenofovir DF. She would like to come back for in person discussion in 3 weeks to determine next steps for treatment/monitoring.       Relevant Orders   Hepatitis B DNA, ultraquantitative, PCR   Hepatitis B e antigen   Comprehensive metabolic panel   Liver Fibrosis, FibroTest-ActiTest   Hep B Core Ab W/Reflex   Hepatitis A Ab, Total     Return in about 3 weeks (around 06/04/2018).   Rexene AlbertsStephanie Kyliegh Jester, MSN, NP-C [facility] Pager: [phone] Office: [phone]  05/14/18  4:17 PM

## 2018-05-15 LAB — HEPATITIS B CORE ANTIBODY, TOTAL: HEP B C TOTAL AB: REACTIVE — AB

## 2018-05-16 LAB — HEPATITIS B DNA, ULTRAQUANTITATIVE, PCR
Hepatitis B DNA (Calc): 6.18 {Log_IU}/mL — ABNORMAL HIGH
Hepatitis B DNA: 1510000 [IU]/mL — ABNORMAL HIGH

## 2018-05-17 LAB — HEPATITIS B E ANTIGEN: HEP B E AG: REACTIVE — AB

## 2018-05-17 LAB — COMPREHENSIVE METABOLIC PANEL
AG Ratio: 1.3 (calc) (ref 1.0–2.5)
ALBUMIN MSPROF: 3.4 g/dL — AB (ref 3.6–5.1)
ALT: 27 U/L (ref 6–29)
AST: 22 U/L (ref 10–30)
Alkaline phosphatase (APISO): 39 U/L (ref 33–115)
BUN: 8 mg/dL (ref 7–25)
CHLORIDE: 107 mmol/L (ref 98–110)
CO2: 23 mmol/L (ref 20–32)
Calcium: 8.7 mg/dL (ref 8.6–10.2)
Creat: 0.56 mg/dL (ref 0.50–1.10)
GLUCOSE: 82 mg/dL (ref 65–99)
Globulin: 2.7 g/dL (calc) (ref 1.9–3.7)
Potassium: 4 mmol/L (ref 3.5–5.3)
Sodium: 138 mmol/L (ref 135–146)
Total Bilirubin: 0.3 mg/dL (ref 0.2–1.2)
Total Protein: 6.1 g/dL (ref 6.1–8.1)

## 2018-05-17 LAB — HEPATITIS A ANTIBODY, TOTAL: Hepatitis A AB,Total: REACTIVE — AB

## 2018-05-19 LAB — LIVER FIBROSIS, FIBROTEST-ACTITEST
ALPHA-2-MACROGLOBULIN: 309 mg/dL — AB (ref 106–279)
ALT: 26 U/L (ref 6–29)
Apolipoprotein A1: 164 mg/dL (ref 101–198)
Bilirubin: 0.3 mg/dL (ref 0.2–1.2)
FIBROSIS SCORE: 0.11
GGT: 15 U/L (ref 3–40)
Haptoglobin: 98 mg/dL (ref 43–212)
NECROINFLAMMAT ACT SCORE: 0.09
Reference ID: 2580919

## 2018-06-08 ENCOUNTER — Encounter: Payer: Self-pay | Admitting: Infectious Diseases

## 2018-06-08 ENCOUNTER — Ambulatory Visit (INDEPENDENT_AMBULATORY_CARE_PROVIDER_SITE_OTHER): Payer: Medicaid Other | Admitting: Infectious Diseases

## 2018-06-08 DIAGNOSIS — R768 Other specified abnormal immunological findings in serum: Secondary | ICD-10-CM | POA: Diagnosis present

## 2018-06-08 MED ORDER — TENOFOVIR DISOPROXIL FUMARATE 300 MG PO TABS: 300.0000 mg | ORAL_TABLET | Freq: Every day | ORAL | 5 refills | Status: AC

## 2018-06-08 NOTE — Progress Notes (Signed)
Patient: Joan Sawyer  DOB: 09/28/1991 MRN: [id number] PCP: Patient, No Pcp Per  Referring Provider: OB  Patient Active Problem List   Diagnosis Date Noted  . Supervision of other normal pregnancy, antepartum 03/16/2018  . Hepatitis B surface antigen positive 07/05/2014     Subjective:   Chief Complaint  Patient presents with  . Follow-up    Chronic Hep B in pregnancy     Joan Sawyer is a 27 y.o. Burmese speaking female here for evaluation of Hep B lab work discovered during pregnancy. [employer] video interpreter was used to facilitate the visit today.   Feeling well overall. She is 24w 6d with her second son and pregnancy going as planned and no complaints. Wondering if she needs medications this time. She recalls "coughing up blood" last time she was on this medication in 2016 which was a reason she discontinued taking this.   Review of Systems  Constitutional: Negative for chills and fever.  HENT: Negative for tinnitus.   Eyes: Negative for blurred vision and photophobia.  Respiratory: Negative for cough and sputum production.   Cardiovascular: Negative for chest pain.  Gastrointestinal: Negative for blood in stool, diarrhea, nausea and vomiting.  Genitourinary: Negative for dysuria.  Skin: Negative for rash.  Neurological: Negative for headaches.  Endo/Heme/Allergies: Does not bruise/bleed easily.    Past Medical History:  Diagnosis Date  . Hepatitis    Hep B positive  . Hepatitis B   . Hx: UTI (urinary tract infection)     Outpatient Medications Prior to Visit  Medication Sig Dispense Refill  . Prenatal Vit-Fe Fumarate-FA (MULTIVITAMIN-PRENATAL) 27-0.8 MG TABS tablet Take 1 tablet by mouth daily at 12 noon. 30 each 12   No facility-administered medications prior to visit.      No Known Allergies  Social History   Tobacco Use  . Smoking status: Never Smoker  . Smokeless tobacco: Never Used  Substance Use Topics  . Alcohol use: No  . Drug use: No     Family History  Problem Relation Age of Onset  . Healthy Mother   . Healthy Father   . Asthma Brother   . Liver disease Neg Hx     Objective:   Vitals:   06/08/18 1006  BP: 98/66  Pulse: 97  Temp: (!) 97.4 F (36.3 C)  TempSrc: Oral  Weight: 159 lb (72.1 kg)   Body mass index is 26.46 kg/m.  Physical Exam  Constitutional: She is oriented to person, place, and time. She appears well-developed and well-nourished.  Seated comfortably in chair today and appears well.   Eyes: No scleral icterus.  Neck: No JVD present.  Cardiovascular: Normal rate and regular rhythm.  Pulmonary/Chest: Effort normal. No respiratory distress.  Abdominal: Bowel sounds are normal. There is no tenderness.  Pregnant with uterus above the level of umbilicus   Musculoskeletal: Normal range of motion.  Lymphadenopathy:    She has no cervical adenopathy.  Neurological: She is alert and oriented to person, place, and time.  Skin: Skin is warm and dry. No rash noted. No pallor.  Psychiatric: She has a normal mood and affect. Judgment normal.   Lab Results: Lab Results  Component Value Date   WBC 4.8 03/16/2018   HGB 10.9 (L) 03/16/2018   HCT 32.8 (L) 03/16/2018   MCV 88 03/16/2018   PLT 236 03/16/2018    Lab Results  Component Value Date   CREATININE 0.56 05/14/2018   BUN 8 05/14/2018   NA  138 05/14/2018   K 4.0 05/14/2018   CL 107 05/14/2018   CO2 23 05/14/2018    Lab Results  Component Value Date   ALT 27 05/14/2018   AST 22 05/14/2018   ALKPHOS 55 03/26/2015   BILITOT 0.3 05/14/2018     Assessment & Plan:   Problem List Items Addressed This Visit      Other   Hepatitis B surface antigen positive    She is HBeAg (+) with DNA > 1.5 million copies but appears to be otherwise immunotolerant with normal LFTs and F0. In accordance with guidelines will start Viread (pregnancy class B) in 3rd trimester (9-17) to prevent intra-uterine transfer of infection. She will continue this  through 1 month post partum to prevent hepatitis flare following delivery.  Her son will need Hepatitis B immune globulin (HBIG) and HBV vaccine  <12 hours after delivery. Would check baseline liver ultrasound sometime in post partum period to assess liver texture/mass effect.  She will return on 10/28 for labs to recheck HBV DNA, HBeAg & LFTs with a visit 2 weeks later to discuss results.       Relevant Orders   Hepatitis B DNA, ultraquantitative, PCR   Hepatitis B e antigen   Comprehensive metabolic panel      Meds ordered this encounter  Medications  . tenofovir (VIREAD) 300 MG tablet    Sig: Take 1 tablet (300 mg total) by mouth daily. Try to take at the same times each day with or without food.    Dispense:  30 tablet    Refill:  5    Order Specific Question:   Supervising Provider    Answer:   HATCHER, JEFFREY C [[id number]]    Return in about 9 weeks (around 08/10/2018) for follow up, labs.   Rexene Alberts, MSN, NP-C [facility] Pager: [phone] Office: [phone]  06/08/18  10:07 PM

## 2018-06-08 NOTE — Patient Instructions (Addendum)
September 17th start taking your Viread one pill once a day with or without food.   Lab visit on October 28th and a visit with Judeth CornfieldStephanie 2 weeks after this.

## 2018-06-08 NOTE — Assessment & Plan Note (Signed)
She is HBeAg (+) with DNA > 1.5 million copies but appears to be otherwise immunotolerant with normal LFTs and F0. In accordance with guidelines will start Viread (pregnancy class B) in 3rd trimester (9-17) to prevent intra-uterine transfer of infection. She will continue this through 1 month post partum to prevent hepatitis flare following delivery.  Her son will need Hepatitis B immune globulin (HBIG) and HBV vaccine  <12 hours after delivery. Would check baseline liver ultrasound sometime in post partum period to assess liver texture/mass effect.  She will return on 10/28 for labs to recheck HBV DNA, HBeAg & LFTs with a visit 2 weeks later to discuss results.

## 2018-06-09 ENCOUNTER — Encounter: Payer: Self-pay | Admitting: Family Medicine

## 2018-06-09 ENCOUNTER — Encounter: Payer: Medicaid Other | Admitting: Medical

## 2018-07-13 ENCOUNTER — Ambulatory Visit (INDEPENDENT_AMBULATORY_CARE_PROVIDER_SITE_OTHER): Payer: Medicaid Other | Admitting: Advanced Practice Midwife

## 2018-07-13 ENCOUNTER — Encounter: Payer: Self-pay | Admitting: Advanced Practice Midwife

## 2018-07-13 VITALS — BP 103/74 | HR 85 | Wt 161.9 lb

## 2018-07-13 DIAGNOSIS — Z3480 Encounter for supervision of other normal pregnancy, unspecified trimester: Secondary | ICD-10-CM

## 2018-07-13 DIAGNOSIS — Z23 Encounter for immunization: Secondary | ICD-10-CM | POA: Diagnosis not present

## 2018-07-13 DIAGNOSIS — Z3483 Encounter for supervision of other normal pregnancy, third trimester: Secondary | ICD-10-CM | POA: Diagnosis present

## 2018-07-13 NOTE — Progress Notes (Addendum)
   PRENATAL VISIT NOTE  Subjective:  Joan Sawyer is a 27 y.o. G2P1001 at [redacted]w[redacted]d being seen today for ongoing prenatal care.  She is currently monitored for the following issues for this low-risk pregnancy and has Hepatitis B surface antigen positive and Supervision of other normal pregnancy, antepartum on their problem list.  Patient reports no complaints.  Contractions: Not present. Vag. Bleeding: None.  Movement: Present. Denies leaking of fluid.   The following portions of the patient's history were reviewed and updated as appropriate: allergies, current medications, past family history, past medical history, past social history, past surgical history and problem list. Problem list updated.  Objective:   Vitals:   07/13/18 1006  BP: 103/74  Pulse: 85  Weight: 161 lb 14.4 oz (73.4 kg)    Fetal Status: Fetal Heart Rate (bpm): 154 Fundal Height: 29 cm Movement: Present     General:  Alert, oriented and cooperative. Patient is in no acute distress.  Skin: Skin is warm and dry. No rash noted.   Cardiovascular: Normal heart rate noted  Respiratory: Normal respiratory effort, no problems with respiration noted  Abdomen: Soft, gravid, appropriate for gestational age.  Pain/Pressure: Absent     Pelvic: Cervical exam deferred        Extremities: Normal range of motion.  Edema: None  Mental Status: Normal mood and affect. Normal behavior. Normal judgment and thought content.   Assessment and Plan:  Pregnancy: G2P1001 at [redacted]w[redacted]d  1. Need for Tdap vaccination - Tdap vaccine greater than or equal to 7yo IM  2. Supervision of other normal pregnancy, antepartum - Flu Vaccine QUAD 36+ mos IM - Glucose Tolerance, 2 Hours w/1 Hour; Future - CBC; Future - HIV Antibody (routine testing w rflx); Future - RPR; Future - Patient is established with ID for chronic hep B - Patient missed last appt. Has not had 28 week labs. Will schedule lab visit ASAP  - Int# [id number]  Preterm labor symptoms and  general obstetric precautions including but not limited to vaginal bleeding, contractions, leaking of fluid and fetal movement were reviewed in detail with the patient. Please refer to After Visit Summary for other counseling recommendations.  Return in about 2 weeks (around 07/27/2018).  Future Appointments  Date Time Provider Department Center  08/10/2018  9:45 AM [facility]  08/24/2018 11:00 AM Blanchard Kelch, NP [facility]    Thressa Sheller, CNM

## 2018-07-20 ENCOUNTER — Other Ambulatory Visit: Payer: Medicaid Other

## 2018-07-20 DIAGNOSIS — Z3480 Encounter for supervision of other normal pregnancy, unspecified trimester: Secondary | ICD-10-CM

## 2018-07-21 LAB — CBC
HEMOGLOBIN: 10.8 g/dL — AB (ref 11.1–15.9)
Hematocrit: 33.4 % — ABNORMAL LOW (ref 34.0–46.6)
MCH: 29.5 pg (ref 26.6–33.0)
MCHC: 32.3 g/dL (ref 31.5–35.7)
MCV: 91 fL (ref 79–97)
PLATELETS: 287 10*3/uL (ref 150–450)
RBC: 3.66 x10E6/uL — AB (ref 3.77–5.28)
RDW: 13.1 % (ref 12.3–15.4)
WBC: 5.6 10*3/uL (ref 3.4–10.8)

## 2018-07-21 LAB — GLUCOSE TOLERANCE, 2 HOURS W/ 1HR
GLUCOSE, 1 HOUR: 86 mg/dL (ref 65–179)
GLUCOSE, 2 HOUR: 48 mg/dL — AB (ref 65–152)
GLUCOSE, FASTING: 72 mg/dL (ref 65–91)

## 2018-07-21 LAB — HIV ANTIBODY (ROUTINE TESTING W REFLEX): HIV SCREEN 4TH GENERATION: NONREACTIVE

## 2018-07-21 LAB — RPR: RPR Ser Ql: NONREACTIVE

## 2018-07-28 ENCOUNTER — Encounter: Payer: Medicaid Other | Admitting: Advanced Practice Midwife

## 2018-08-10 ENCOUNTER — Other Ambulatory Visit: Payer: Medicaid Other

## 2018-08-10 DIAGNOSIS — R768 Other specified abnormal immunological findings in serum: Secondary | ICD-10-CM

## 2018-08-12 ENCOUNTER — Ambulatory Visit (INDEPENDENT_AMBULATORY_CARE_PROVIDER_SITE_OTHER): Payer: Medicaid Other | Admitting: Family Medicine

## 2018-08-12 VITALS — BP 109/74 | HR 100 | Wt 165.0 lb

## 2018-08-12 DIAGNOSIS — R768 Other specified abnormal immunological findings in serum: Secondary | ICD-10-CM

## 2018-08-12 DIAGNOSIS — Z3480 Encounter for supervision of other normal pregnancy, unspecified trimester: Secondary | ICD-10-CM

## 2018-08-12 LAB — COMPREHENSIVE METABOLIC PANEL
AG Ratio: 1.1 (calc) (ref 1.0–2.5)
ALT: 33 U/L — AB (ref 6–29)
AST: 27 U/L (ref 10–30)
Albumin: 3.1 g/dL — ABNORMAL LOW (ref 3.6–5.1)
Alkaline phosphatase (APISO): 64 U/L (ref 33–115)
BUN / CREAT RATIO: 14 (calc) (ref 6–22)
BUN: 6 mg/dL — AB (ref 7–25)
CO2: 24 mmol/L (ref 20–32)
Calcium: 8.3 mg/dL — ABNORMAL LOW (ref 8.6–10.2)
Chloride: 106 mmol/L (ref 98–110)
Creat: 0.42 mg/dL — ABNORMAL LOW (ref 0.50–1.10)
GLOBULIN: 2.9 g/dL (ref 1.9–3.7)
GLUCOSE: 119 mg/dL — AB (ref 65–99)
Potassium: 3.6 mmol/L (ref 3.5–5.3)
Sodium: 136 mmol/L (ref 135–146)
Total Bilirubin: 0.6 mg/dL (ref 0.2–1.2)
Total Protein: 6 g/dL — ABNORMAL LOW (ref 6.1–8.1)

## 2018-08-12 LAB — HEPATITIS B DNA, ULTRAQUANTITATIVE, PCR
Hepatitis B DNA (Calc): 7.32 Log IU/mL — ABNORMAL HIGH
Hepatitis B DNA: 20700000 IU/mL — ABNORMAL HIGH

## 2018-08-12 LAB — HEPATITIS B E ANTIGEN: Hep B E Ag: REACTIVE — AB

## 2018-08-12 NOTE — Progress Notes (Signed)
   PRENATAL VISIT NOTE Subjective:  Joan Sawyer is a 27 y.o. G2P1001 at [redacted]w[redacted]d being seen today for ongoing prenatal care.  She is currently monitored for the following issues for this high-risk pregnancy and has Hepatitis B surface antigen positive and Supervision of other normal pregnancy, antepartum on their problem list.  - birth control - Nexplanon  - no side effects from medication for Hep B, feeling well, no abdominal pain, N/V   Patient reports no complaints.  Contractions: Not present. Vag. Bleeding: None.  Movement: Present. Denies leaking of fluid.   The following portions of the patient's history were reviewed and updated as appropriate: allergies, current medications, past family history, past medical history, past social history, past surgical history and problem list. Problem list updated.  Objective:   Vitals:   08/12/18 0832  BP: 109/74  Pulse: 100  Weight: 165 lb (74.8 kg)    Fetal Status: Fetal Heart Rate (bpm): 148 Fundal Height: 33 cm Movement: Present     General:  Alert, oriented and cooperative. Patient is in no acute distress.  Skin: Skin is warm and dry. No rash noted.   Cardiovascular: Normal heart rate noted  Respiratory: Normal respiratory effort, no problems with respiration noted  Abdomen: Soft, gravid, appropriate for gestational age.  Pain/Pressure: Absent     Pelvic: Cervical exam deferred        Extremities: Normal range of motion.  Edema: None  Mental Status: Normal mood and affect. Normal behavior. Normal judgment and thought content.   Assessment and Plan:  Pregnancy: G2P1001 at [redacted]w[redacted]d  1. Supervision of other normal pregnancy, antepartum -- prenatal care reviewed and UTD -- counseled on contraception, prefers Nexplanon   2. Hepatitis B surface antigen positive -- continue Viread, discussed need for ultrasound and continuing medication until 1 month postpartum   Preterm labor symptoms and general obstetric precautions including but not  limited to vaginal bleeding, contractions, leaking of fluid and fetal movement were reviewed in detail with the patient.Please refer to After Visit Summary for other counseling recommendations.   Return in about 2 weeks (around 08/26/2018) for routine OB .  Future Appointments  Date Time Provider Department Center  08/24/2018 11:00 AM Blanchard Kelch, NP [facility]   Tamera Stands, DO

## 2018-08-18 ENCOUNTER — Telehealth: Payer: Self-pay | Admitting: Behavioral Health

## 2018-08-18 NOTE — Telephone Encounter (Signed)
Thank you for arranging that. I wish she called [place] earlier if she had trouble getting the medication. I appreciate your help in taking care of Joan Sawyer.

## 2018-08-18 NOTE — Telephone Encounter (Signed)
Called patient using [employer] [facility] ID:SMS)  Verified patient identity.   Spoke with patient about Viread she states she never picked up the medication because the pharmacy did not have it.  Explained to patient that her  Hep. B viral load was increasing.  Informed patient is  important for her to come to her upcoming appointment Monday November 11,2019 at 11:00 with Rexene Alberts NP.  Patient verbalized understanding.    Called [facility] and they stated patient never picked up the medication.  They are getting the prescription ready for patient to pick up.   Called Orianna back and let her know medication will be ready for pick up in 1-2 hours and she states she will go pick it up tomorrow. Angeline Slim RN

## 2018-08-18 NOTE — Telephone Encounter (Signed)
-----   Message from Blanchard Kelch, NP sent at 08/17/2018  2:26 PM EST ----- Please call Eren (will need Burmese interpretor) to see if she is taking her Viread every day. Her hepatitis b level is much higher and I suspect she is not taking it.  Thank you - will talk more at her upcoming visit but considering she is pregnant would like to also place phone call for urgency.

## 2018-08-24 ENCOUNTER — Ambulatory Visit (INDEPENDENT_AMBULATORY_CARE_PROVIDER_SITE_OTHER): Payer: Medicaid Other | Admitting: Infectious Diseases

## 2018-08-24 ENCOUNTER — Encounter: Payer: Self-pay | Admitting: Family Medicine

## 2018-08-24 ENCOUNTER — Ambulatory Visit (INDEPENDENT_AMBULATORY_CARE_PROVIDER_SITE_OTHER): Payer: Medicaid Other | Admitting: Family Medicine

## 2018-08-24 ENCOUNTER — Encounter: Payer: Self-pay | Admitting: Infectious Diseases

## 2018-08-24 VITALS — BP 113/73 | HR 104 | Temp 98.5°F | Ht 65.0 in | Wt 171.0 lb

## 2018-08-24 VITALS — BP 110/78 | HR 102 | Wt 172.0 lb

## 2018-08-24 DIAGNOSIS — R768 Other specified abnormal immunological findings in serum: Secondary | ICD-10-CM

## 2018-08-24 DIAGNOSIS — Z3483 Encounter for supervision of other normal pregnancy, third trimester: Secondary | ICD-10-CM

## 2018-08-24 DIAGNOSIS — Z3480 Encounter for supervision of other normal pregnancy, unspecified trimester: Secondary | ICD-10-CM

## 2018-08-24 NOTE — Progress Notes (Signed)
Patient: Joan Sawyer  DOB: 1991/08/26 MRN: [id number] PCP: Patient, No Pcp Per  Referring Provider: OB  Patient Active Problem List   Diagnosis Date Noted  . Supervision of other normal pregnancy, antepartum 03/16/2018  . Hepatitis B surface antigen positive 07/05/2014     Subjective:  Aliani Caccavale is a 27 y.o. Burmese speaking female with hepatitis b infection. She is currently in her third trimester of second pregnancy. 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 Interpretor was present to facilitate.   She has no complaints today and is feeling well overall. She is 35w 6d and readying herself for her new baby. No contractions yet. She did not pick up her medication until after she had her labs done; apparently pharmacy was having trouble getting in touch with her. She is now 2 days into taking it and has no s/e at this point. She is wondering if she will need to take this long term and how long she is planning to breast feed after delivery.   Review of Systems  Constitutional: Negative for chills and fever.  HENT: Negative for tinnitus.   Eyes: Negative for blurred vision and photophobia.  Respiratory: Negative for cough and sputum production.   Cardiovascular: Negative for chest pain.  Gastrointestinal: Negative for blood in stool, diarrhea, nausea and vomiting.  Genitourinary: Negative for dysuria.  Skin: Negative for rash.  Neurological: Negative for headaches.  Endo/Heme/Allergies: Does not bruise/bleed easily.    Past Medical History:  Diagnosis Date  . Hepatitis    Hep B positive  . Hepatitis B   . Hx: UTI (urinary tract infection)     Outpatient Medications Prior to Visit  Medication Sig Dispense Refill  . Prenatal Vit-Fe Fumarate-FA (MULTIVITAMIN-PRENATAL) 27-0.8 MG TABS tablet Take 1 tablet by mouth daily at 12 noon. 30 each 12  . tenofovir (VIREAD) 300 MG tablet Take 1 tablet (300 mg total) by mouth daily. Try to take at the same times each day  with or without food. 30 tablet 5   No facility-administered medications prior to visit.     No Known Allergies  Social History   Tobacco Use  . Smoking status: Never Smoker  . Smokeless tobacco: Never Used  Substance Use Topics  . Alcohol use: No  . Drug use: No    Objective:   Vitals:   08/24/18 1122  BP: 113/73  Pulse: (!) 104  Temp: 98.5 F (36.9 C)  TempSrc: Oral  Weight: 171 lb (77.6 kg)  Height: 5\' 5"  (1.651 m)   Body mass index is 28.46 kg/m.  Physical Exam  Constitutional: She is oriented to person, place, and time. She appears well-developed and well-nourished.  Seated comfortably in chair today and appears well.   Eyes: No scleral icterus.  Neck: No JVD present.  Cardiovascular: Normal rate and regular rhythm.  Pulmonary/Chest: Effort normal. No respiratory distress.  Abdominal: Bowel sounds are normal. There is no tenderness.  Pregnant   Musculoskeletal: Normal range of motion.  Lymphadenopathy:    She has no cervical adenopathy.  Neurological: She is alert and oriented to person, place, and time.  Skin: Skin is warm and dry. No rash noted. No pallor.  Psychiatric: She has a normal mood and affect. Judgment normal.   Lab Results: Lab Results  Component Value Date   WBC 5.6 07/20/2018   HGB 10.8 (L) 07/20/2018   HCT 33.4 (L) 07/20/2018   MCV 91 07/20/2018   PLT 287 07/20/2018  Lab Results  Component Value Date   CREATININE 0.42 (L) 08/10/2018   BUN 6 (L) 08/10/2018   NA 136 08/10/2018   K 3.6 08/10/2018   CL 106 08/10/2018   CO2 24 08/10/2018    Lab Results  Component Value Date   ALT 33 (H) 08/10/2018   AST 27 08/10/2018   ALKPHOS 55 03/26/2015   BILITOT 0.6 08/10/2018     Assessment & Plan:   Problem List Items Addressed This Visit      Unprioritized   Hepatitis B surface antigen positive - Primary    She has (+)HBeAg w/o hepatitis or fibrosis on noninvasive testing; DNA 20 million copies in the setting of delayed  medication access. She will continue Viread through delivery and into first few months of post-partum period. Will not achieve much suppression of her virus but will recheck in 3 weeks, although this does not change her plan for delivery or the fact that her child will need HBIG and HBV w/in the first 12 hours of delivery. The necessity of a c/section to prevent transfer of infection is not well established and not recommended.   Breastfeeding is not contraindicated - advised to avoid in the setting of cracked/bleeding nipples. Stressed importance of getting follow up vaccines after delivery on time for her son. Antivirals are minimally excreted in breast milk and are unlikely to cause significant toxicity--discussed in detail today.   Will need monitoring for acute hepatitis flare after delivery when we plan on stopping medication. Will continue Q36m with labs for 6-42m. She will return in 3 months.       Relevant Orders   Hepatitis B DNA, ultraquantitative, PCR     Follow up in 3 months; labs in 3 weeks.   Rexene Alberts, MSN, NP-C [facility] for Infectious Disease [facility] Pager: [phone] Office: [phone]  08/25/18  9:50 PM

## 2018-08-24 NOTE — Patient Instructions (Addendum)
Breastfeeding-It is important that your baby gets all the doses of hepatitis B vaccine after the one he will get at birth. You can breast feed but please do not if there is any bleeding at your nipples. If you are planning to breastfeed will stop your medication after you deliver and see you back in 4 months to repeat your blood work.   Please come back for labs in 3 weeks and a visit with me in 3 months.   Continue taking your medication every day - 6 hours apart from your prenatal vitamin.

## 2018-08-24 NOTE — Progress Notes (Signed)
   PRENATAL VISIT NOTE *Burmese interpreter used for visit, live interpreter* Subjective:  Joan Sawyer is a 27 y.o. G2P1001 at [redacted]w[redacted]d being seen today for ongoing prenatal care.  She is currently monitored for the following issues for this low-risk pregnancy and has Hepatitis B surface antigen positive and Supervision of other normal pregnancy, antepartum on their problem list.  - going to pick up Hep B medication today, hasn't started yet - saw ID this morning   Patient reports no complaints.  Contractions: Irritability. Vag. Bleeding: None.  Movement: Present. Denies leaking of fluid.   The following portions of the patient's history were reviewed and updated as appropriate: allergies, current medications, past family history, past medical history, past social history, past surgical history and problem list. Problem list updated.  Objective:   Vitals:   08/24/18 1614  BP: 110/78  Pulse: (!) 102  Weight: 172 lb (78 kg)   Fetal Status: Fetal Heart Rate (bpm): 166 Fundal Height: 35 cm Movement: Present     General:  Alert, oriented and cooperative. Patient is in no acute distress.  Skin: Skin is warm and dry. No rash noted.   Cardiovascular: Normal heart rate noted  Respiratory: Normal respiratory effort, no problems with respiration noted  Abdomen: Soft, gravid, appropriate for gestational age.  Pain/Pressure: Present     Pelvic: Cervical exam deferred        Extremities: Normal range of motion.  Edema: None  Mental Status: Normal mood and affect. Normal behavior. Normal judgment and thought content.   Assessment and Plan:  Pregnancy: G2P1001 at [redacted]w[redacted]d  1. Supervision of other normal pregnancy, antepartum -- prenatal record reviewed and UTD  2. Hepatitis B surface antigen positive -- counseled on importance of medication -- continue to follow with ID -- repeat viral load pending   Preterm labor symptoms and general obstetric precautions including but not limited to vaginal  bleeding, contractions, leaking of fluid and fetal movement were reviewed in detail with the patient. Please refer to After Visit Summary for other counseling recommendations.  Return in about 1 week (around 08/31/2018) for HROB.  Future Appointments  Date Time Provider Department Center  09/08/2018  9:35 AM Marylene Land, CNM [facility]  09/14/2018 11:15 AM [facility]  09/15/2018  8:55 AM Marylene Land, CNM [facility]  11/17/2018  9:30 AM Blanchard Kelch, NP [facility]    Tamera Stands, DO

## 2018-08-25 NOTE — Assessment & Plan Note (Addendum)
She has (+)HBeAg w/o hepatitis or fibrosis on noninvasive testing; DNA 20 million copies in the setting of delayed medication access. She will continue Viread through delivery and into first few months of post-partum period. Will not achieve much suppression of her virus but will recheck in 3 weeks, although this does not change her plan for delivery or the fact that her child will need HBIG and HBV w/in the first 12 hours of delivery. The necessity of a c/section to prevent transfer of infection is not well established and not recommended.   Breastfeeding is not contraindicated - advised to avoid in the setting of cracked/bleeding nipples. Stressed importance of getting follow up vaccines after delivery on time for her son. Antivirals are minimally excreted in breast milk and are unlikely to cause significant toxicity--discussed in detail today.   Will need monitoring for acute hepatitis flare after delivery when we plan on stopping medication. Will continue Q6364m with labs for 6-4553m. She will return in 3 months.

## 2018-08-26 ENCOUNTER — Encounter: Payer: Medicaid Other | Admitting: Nurse Practitioner

## 2018-09-01 ENCOUNTER — Ambulatory Visit (INDEPENDENT_AMBULATORY_CARE_PROVIDER_SITE_OTHER): Payer: Medicaid Other | Admitting: Student

## 2018-09-01 ENCOUNTER — Other Ambulatory Visit (HOSPITAL_COMMUNITY)
Admission: RE | Admit: 2018-09-01 | Discharge: 2018-09-01 | Disposition: A | Discharge status: Home / Self Care | Payer: Medicaid Other | Source: Ambulatory Visit | Attending: Student | Admitting: Student

## 2018-09-01 VITALS — BP 109/77 | HR 99 | Wt 166.6 lb

## 2018-09-01 DIAGNOSIS — Z3483 Encounter for supervision of other normal pregnancy, third trimester: Secondary | ICD-10-CM

## 2018-09-01 DIAGNOSIS — Z3A37 37 weeks gestation of pregnancy: Secondary | ICD-10-CM

## 2018-09-01 DIAGNOSIS — Z3480 Encounter for supervision of other normal pregnancy, unspecified trimester: Secondary | ICD-10-CM

## 2018-09-01 DIAGNOSIS — R768 Other specified abnormal immunological findings in serum: Secondary | ICD-10-CM

## 2018-09-01 LAB — OB RESULTS CONSOLE GC/CHLAMYDIA: Gonorrhea: NEGATIVE

## 2018-09-01 LAB — OB RESULTS CONSOLE GBS: GBS: NEGATIVE

## 2018-09-01 NOTE — Patient Instructions (Signed)
Hepatitis B Immune Globulin, HBIG injection What is this medicine? HEPATITIS B IMMUNE GLOBULIN (hep uh TAHY tis B im MUNE GLOB yoo lin) is used to prevent infections of hepatitis B after close contact with someone who has the infection. This medicine may be used for other purposes; ask your health care provider or pharmacist if you have questions. COMMON BRAND NAME(S): BayHep B, Hep-B-Gammagee, Hepagam B, HyperHEP S/D, Nabi-HB What should I tell my health care provider before I take this medicine? They need to know if you have any of these conditions: -bleeding disorder -low levels of immunoglobulin A in the body -low levels of platelets in the blood -an unusual or allergic reaction to human immune globulin, other medicines, foods, dyes, or preservatives -pregnant or trying to get pregnant -breast-feeding How should I use this medicine? This medicine is for infusion into a muscle. It is given by a health care professional in a hospital or clinic setting. Talk to your pediatrician regarding the use of this medicine in children. While this drug may be prescribed for children as young as newborn for selected conditions, precautions do apply. Overdosage: If you think you have taken too much of this medicine contact a poison control center or emergency room at once. NOTE: This medicine is only for you. Do not share this medicine with others. What if I miss a dose? This does not apply. What may interact with this medicine? -live virus vaccines This list may not describe all possible interactions. Give your health care provider a list of all the medicines, herbs, non-prescription drugs, or dietary supplements you use. Also tell them if you smoke, drink alcohol, or use illegal drugs. Some items may interact with your medicine. What should I watch for while using this medicine? This medicine is made from human blood. It may be possible to pass an infection in this medicine. Talk to your doctor about  the risks and benefits of this medicine. This medicine may interfere with live virus vaccines. Before you get other live virus vaccines tell your health care professional if you have received this medicine within the past 3 months. What side effects may I notice from receiving this medicine? Side effects that you should report to your doctor or health care professional as soon as possible: -allergic reactions like skin rash, itching or hives, swelling of the face, lips, or tongue -breathing problems -chest pain or tightness Side effects that usually do not require medical attention (report to your doctor or health care professional if they continue or are bothersome): -pain and tenderness at site where injected This list may not describe all possible side effects. Call your doctor for medical advice about side effects. You may report side effects to FDA at 1-800-FDA-1088. Where should I keep my medicine? This drug is given in a hospital or clinic and will not be stored at home. NOTE: This sheet is a summary. It may not cover all possible information. If you have questions about this medicine, talk to your doctor, pharmacist, or health care provider.  2018 Elsevier/Gold Standard (2008-02-12 15:14:49)   

## 2018-09-01 NOTE — Progress Notes (Signed)
   PRENATAL VISIT NOTE  Subjective:  Joan Sawyer is a 27 y.o. G2P1001 at 3026w0d being seen today for ongoing prenatal care.  She is currently monitored for the following issues for this low-risk pregnancy and has Hepatitis B surface antigen positive and Supervision of other normal pregnancy, antepartum on their problem list.  Patient reports no complaints. She started taking her Hep B medicine "four or 5 days ago". She says it makes her sleepy.  Contractions: Not present. Vag. Bleeding: None.  Movement: Present. Denies leaking of fluid.   The following portions of the patient's history were reviewed and updated as appropriate: allergies, current medications, past family history, past medical history, past social history, past surgical history and problem list. Problem list updated.  Objective:   Vitals:   09/01/18 1029  BP: 109/77  Pulse: 99  Weight: 166 lb 9.6 oz (75.6 kg)    Fetal Status: Fetal Heart Rate (bpm): 141 Fundal Height: 36 cm Movement: Present  Presentation: Vertex  General:  Alert, oriented and cooperative. Patient is in no acute distress.  Skin: Skin is warm and dry. No rash noted.   Cardiovascular: Normal heart rate noted  Respiratory: Normal respiratory effort, no problems with respiration noted  Abdomen: Soft, gravid, appropriate for gestational age.  Pain/Pressure: Present     Pelvic: Cervical exam performed Dilation: 1 Effacement (%): 20    Extremities: Normal range of motion.  Edema: None  Mental Status: Normal mood and affect. Normal behavior. Normal judgment and thought content.   Assessment and Plan:  Pregnancy: G2P1001 at 3526w0d  1. Supervision of other normal pregnancy, antepartum  - [facility]Chlam[facility] - Culture, beta strep (group b only)  2. Hepatitis B surface antigen positive -Recommended that patient take her Tenofovir at night since it makes her sleepy; stressed importance of taking medicine every day. If she is unable to  take medicine or has any questions about it, [facility] to [facility].  Also explained that baby will need HepB shots after delivery; patient understands need for both her medicine and baby medicine and agrees to contact ID if she has any concerns with her Viread.   Preterm labor symptoms and general obstetric precautions including but not limited to vaginal bleeding, contractions, leaking of fluid and fetal movement were reviewed in detail with the patient. Please refer to After Visit Summary for other counseling recommendations.  Return in about 1 week (around 09/08/2018), or LROB.  Future Appointments  Date Time Provider Department Center  09/08/2018  9:35 AM Marylene LandKooistra, Kathryn Lorraine, CNM [facility]  09/14/2018 11:15 AM [facility]  09/15/2018  8:55 AM Crisoforo OxfordKooistra, Charlesetta GaribaldiKathryn Lorraine, CNM [facility]  11/17/2018  9:30 AM Blanchard Kelchixon, Stephanie N, NP [facility]    Marylene LandKathryn Lorraine Kooistra, CNM

## 2018-09-02 LAB — GC/CHLAMYDIA PROBE AMP (~~LOC~~) NOT AT ARMC
Chlamydia: NEGATIVE
Neisseria Gonorrhea: NEGATIVE

## 2018-09-04 LAB — CULTURE, BETA STREP (GROUP B ONLY): Strep Gp B Culture: NEGATIVE

## 2018-09-08 ENCOUNTER — Ambulatory Visit (INDEPENDENT_AMBULATORY_CARE_PROVIDER_SITE_OTHER): Payer: Medicaid Other | Admitting: Student

## 2018-09-08 VITALS — BP 105/76 | HR 112 | Wt 168.0 lb

## 2018-09-08 DIAGNOSIS — R768 Other specified abnormal immunological findings in serum: Secondary | ICD-10-CM

## 2018-09-08 DIAGNOSIS — Z3A38 38 weeks gestation of pregnancy: Secondary | ICD-10-CM

## 2018-09-08 DIAGNOSIS — Z3483 Encounter for supervision of other normal pregnancy, third trimester: Secondary | ICD-10-CM

## 2018-09-08 DIAGNOSIS — Z3480 Encounter for supervision of other normal pregnancy, unspecified trimester: Secondary | ICD-10-CM

## 2018-09-08 NOTE — Patient Instructions (Signed)
Hepatitis B Immune Globulin, HBIG injection What is this medicine? HEPATITIS B IMMUNE GLOBULIN (hep uh TAHY tis B im MUNE GLOB yoo lin) is used to prevent infections of hepatitis B after close contact with someone who has the infection. This medicine may be used for other purposes; ask your health care provider or pharmacist if you have questions. COMMON BRAND NAME(S): BayHep B, Hep-B-Gammagee, Hepagam B, HyperHEP S/D, Nabi-HB What should I tell my health care provider before I take this medicine? They need to know if you have any of these conditions: -bleeding disorder -low levels of immunoglobulin A in the body -low levels of platelets in the blood -an unusual or allergic reaction to human immune globulin, other medicines, foods, dyes, or preservatives -pregnant or trying to get pregnant -breast-feeding How should I use this medicine? This medicine is for infusion into a muscle. It is given by a health care professional in a hospital or clinic setting. Talk to your pediatrician regarding the use of this medicine in children. While this drug may be prescribed for children as young as newborn for selected conditions, precautions do apply. Overdosage: If you think you have taken too much of this medicine contact a poison control center or emergency room at once. NOTE: This medicine is only for you. Do not share this medicine with others. What if I miss a dose? This does not apply. What may interact with this medicine? -live virus vaccines This list may not describe all possible interactions. Give your health care provider a list of all the medicines, herbs, non-prescription drugs, or dietary supplements you use. Also tell them if you smoke, drink alcohol, or use illegal drugs. Some items may interact with your medicine. What should I watch for while using this medicine? This medicine is made from human blood. It may be possible to pass an infection in this medicine. Talk to your doctor about  the risks and benefits of this medicine. This medicine may interfere with live virus vaccines. Before you get other live virus vaccines tell your health care professional if you have received this medicine within the past 3 months. What side effects may I notice from receiving this medicine? Side effects that you should report to your doctor or health care professional as soon as possible: -allergic reactions like skin rash, itching or hives, swelling of the face, lips, or tongue -breathing problems -chest pain or tightness Side effects that usually do not require medical attention (report to your doctor or health care professional if they continue or are bothersome): -pain and tenderness at site where injected This list may not describe all possible side effects. Call your doctor for medical advice about side effects. You may report side effects to FDA at 1-800-FDA-1088. Where should I keep my medicine? This drug is given in a hospital or clinic and will not be stored at home. NOTE: This sheet is a summary. It may not cover all possible information. If you have questions about this medicine, talk to your doctor, pharmacist, or health care provider.  2018 Elsevier/Gold Standard (2008-02-12 15:14:49)

## 2018-09-08 NOTE — Progress Notes (Signed)
   PRENATAL VISIT NOTE  Subjective:  Joan Sawyer is a 27 y.o. G2P1001 at 1322w0d being seen today for ongoing prenatal care.  She is currently monitored for the following issues for this low-risk pregnancy and has Hepatitis B surface antigen positive and Supervision of other normal pregnancy, antepartum on their problem list.  Patient reports no complaints. She feels like the baby has dropped lower into her pelvis. .  Contractions: Irregular. Vag. Bleeding: None.  Movement: Present. Denies leaking of fluid.   The following portions of the patient's history were reviewed and updated as appropriate: allergies, current medications, past family history, past medical history, past social history, past surgical history and problem list. Problem list updated.  Objective:   Vitals:   09/08/18 1024  BP: 105/76  Pulse: (!) 112  Weight: 168 lb (76.2 kg)    Fetal Status: Fetal Heart Rate (bpm): 151 Fundal Height: 36 cm Movement: Present     General:  Alert, oriented and cooperative. Patient is in no acute distress.  Skin: Skin is warm and dry. No rash noted.   Cardiovascular: Normal heart rate noted  Respiratory: Normal respiratory effort, no problems with respiration noted  Abdomen: Soft, gravid, appropriate for gestational age.  Pain/Pressure: Present     Pelvic: Cervical exam deferred        Extremities: Normal range of motion.  Edema: None  Mental Status: Normal mood and affect. Normal behavior. Normal judgment and thought content.   Assessment and Plan:  Pregnancy: G2P1001 at 6322w0d  1. Supervision of other normal pregnancy, antepartum   2. Hepatitis B surface antigen positive    2. Patient doing well; states she is taking Viread daily.  3. Denies pain, contractions, discomfort, abnormal bleeding.    Term labor symptoms and general obstetric precautions including but not limited to vaginal bleeding, contractions, leaking of fluid and fetal movement were reviewed in detail with the  patient. Please refer to After Visit Summary for other counseling recommendations.  No follow-ups on file.  Future Appointments  Date Time Provider Department Center  09/14/2018 11:15 AM [facility]  09/15/2018  8:55 AM Crisoforo OxfordKooistra, Charlesetta GaribaldiKathryn Lorraine, CNM [facility]  11/17/2018  9:30 AM Blanchard Kelchixon, Stephanie N, NP [facility]    Joan Sawyer, CNM

## 2018-09-14 ENCOUNTER — Other Ambulatory Visit: Payer: Medicaid Other

## 2018-09-14 DIAGNOSIS — R768 Other specified abnormal immunological findings in serum: Secondary | ICD-10-CM

## 2018-09-15 ENCOUNTER — Ambulatory Visit (INDEPENDENT_AMBULATORY_CARE_PROVIDER_SITE_OTHER): Payer: Medicaid Other | Admitting: Student

## 2018-09-15 ENCOUNTER — Other Ambulatory Visit: Payer: Self-pay | Admitting: Student

## 2018-09-15 DIAGNOSIS — Z3483 Encounter for supervision of other normal pregnancy, third trimester: Secondary | ICD-10-CM

## 2018-09-15 DIAGNOSIS — Z3480 Encounter for supervision of other normal pregnancy, unspecified trimester: Secondary | ICD-10-CM

## 2018-09-15 NOTE — Progress Notes (Signed)
   PRENATAL VISIT NOTE  Subjective:  Joan Sawyer is a 27 y.o. G2P1001 at 7942w0d being seen today for ongoing prenatal care.  She is currently monitored for the following issues for this low-risk pregnancy and has Hepatitis B surface antigen positive and Supervision of other normal pregnancy, antepartum on their problem list.  Patient reports backache on her left back near her spine. She denies burning with urination. .  Contractions: Not present. Vag. Bleeding: None.  Movement: Present. Denies leaking of fluid.   The following portions of the patient's history were reviewed and updated as appropriate: allergies, current medications, past family history, past medical history, past social history, past surgical history and problem list. Problem list updated.  Objective:   Vitals:   09/15/18 0903  BP: 111/77  Pulse: 94  Weight: 169 lb 8 oz (76.9 kg)    Fetal Status: Fetal Heart Rate (bpm): 146 Fundal Height: 38 cm Movement: Present     General:  Alert, oriented and cooperative. Patient is in no acute distress.  Skin: Skin is warm and dry. No rash noted.   Cardiovascular: Normal heart rate noted  Respiratory: Normal respiratory effort, no problems with respiration noted  Abdomen: Soft, gravid, appropriate for gestational age.  Pain/Pressure: Present     Pelvic: Cervical exam deferred        Extremities: Normal range of motion.  Edema: None  Mental Status: Normal mood and affect. Normal behavior. Normal judgment and thought content.   Assessment and Plan:  Pregnancy: G2P1001 at 5442w0d 1. Supervision of other normal pregnancy, antepartum    2. Low suspicion for UTI based on symptoms.   3. Induction scheduled for December 17 at 7 am; orders placed.   4. Plan for patient to have NST/BPP on 12/12 or 12/13 and visit with provider. Patient to have cervical exam at that visit to determine if FB appt on 12/16 is reasonable.   Term labor symptoms and general obstetric precautions including but  not limited to vaginal bleeding, contractions, leaking of fluid and fetal movement were reviewed in detail with the patient. Please refer to After Visit Summary for other counseling recommendations.  Return NST/BPP and appt with provider on 12/13 or 12/14. NST/BPP don't have to be on same day.  .  Future Appointments  Date Time Provider Department Center  09/24/2018  8:15 AM [facility] NST [facility]  09/24/2018  9:15 AM Hermina StaggersErvin, Michael L, MD [facility]  09/29/2018  7:00 AM [facility] ROOM [facility] None  11/17/2018  9:30 AM Blanchard Kelchixon, Stephanie N, NP [facility]    Marylene LandKathryn Lorraine Kalvyn Desa, CNM

## 2018-09-15 NOTE — Patient Instructions (Signed)
Labor Induction Labor induction is when steps are taken to cause a pregnant woman to begin the labor process. Most women go into labor on their own between 37 weeks and 42 weeks of the pregnancy. When this does not happen or when there is a medical need, methods may be used to induce labor. Labor induction causes a pregnant woman's uterus to contract. It also causes the cervix to soften (ripen), open (dilate), and thin out (efface). Usually, labor is not induced before 39 weeks of the pregnancy unless there is a problem with the baby or mother. Before inducing labor, your health care provider will consider a number of factors, including the following:  The medical condition of you and the baby.  How many weeks along you are.  The status of the baby's lung maturity.  The condition of the cervix.  The position of the baby. What are the reasons for labor induction? Labor may be induced for the following reasons:  The health of the baby or mother is at risk.  The pregnancy is overdue by 1 week or more.  The water breaks but labor does not start on its own.  The mother has a health condition or serious illness, such as high blood pressure, infection, placental abruption, or diabetes.  The amniotic fluid amounts are low around the baby.  The baby is distressed. Convenience or wanting the baby to be born on a certain date is not a reason for inducing labor. What methods are used for labor induction? Several methods of labor induction may be used, such as:  Prostaglandin medicine. This medicine causes the cervix to dilate and ripen. The medicine will also start contractions. It can be taken by mouth or by inserting a suppository into the vagina.  Inserting a thin tube (catheter) with a balloon on the end into the vagina to dilate the cervix. Once inserted, the balloon is expanded with water, which causes the cervix to open.  Stripping the membranes. Your health care provider separates  amniotic sac tissue from the cervix, causing the cervix to be stretched and causing the release of a hormone called progesterone. This may cause the uterus to contract. It is often done during an office visit. You will be sent home to wait for the contractions to begin. You will then come in for an induction.  Breaking the water. Your health care provider makes a hole in the amniotic sac using a small instrument. Once the amniotic sac breaks, contractions should begin. This may still take hours to see an effect.  Medicine to trigger or strengthen contractions. This medicine is given through an IV access tube inserted into a vein in your arm. All of the methods of induction, besides stripping the membranes, will be done in the hospital. Induction is done in the hospital so that you and the baby can be carefully monitored. How long does it take for labor to be induced? Some inductions can take up to 2-3 days. Depending on the cervix, it usually takes less time. It takes longer when you are induced early in the pregnancy or if this is your first pregnancy. If a mother is still pregnant and the induction has been going on for 2-3 days, either the mother will be sent home or a cesarean delivery will be needed. What are the risks associated with labor induction? Some of the risks of induction include:  Changes in fetal heart rate, such as too high, too low, or erratic.  Fetal distress.    Chance of infection for the mother and baby.  Increased chance of having a cesarean delivery.  Breaking off (abruption) of the placenta from the uterus (rare).  Uterine rupture (very rare). When induction is needed for medical reasons, the benefits of induction may outweigh the risks. What are some reasons for not inducing labor? Labor induction should not be done if:  It is shown that your baby does not tolerate labor.  You have had previous surgeries on your uterus, such as a myomectomy or the removal of  fibroids.  Your placenta lies very low in the uterus and blocks the opening of the cervix (placenta previa).  Your baby is not in a head-down position.  The umbilical cord drops down into the birth canal in front of the baby. This could cut off the baby's blood and oxygen supply.  You have had a previous cesarean delivery.  There are unusual circumstances, such as the baby being extremely premature. This information is not intended to replace advice given to you by your health care provider. Make sure you discuss any questions you have with your health care provider. Document Released: 02/19/2007 Document Revised: 03/07/2016 Document Reviewed: 04/29/2013 Elsevier Interactive Patient Education  2017 Elsevier Inc.  

## 2018-09-16 LAB — HEPATITIS B DNA, ULTRAQUANTITATIVE, PCR
Hepatitis B DNA (Calc): 3.67 Log IU/mL — ABNORMAL HIGH
Hepatitis B DNA: 4710 IU/mL — ABNORMAL HIGH

## 2018-09-18 ENCOUNTER — Telehealth (HOSPITAL_COMMUNITY): Payer: Self-pay | Admitting: *Deleted

## 2018-09-18 NOTE — Telephone Encounter (Signed)
Preadmission screen Interpreter number [phone]

## 2018-09-22 ENCOUNTER — Other Ambulatory Visit: Payer: Self-pay

## 2018-09-22 ENCOUNTER — Inpatient Hospital Stay (HOSPITAL_COMMUNITY)
Admission: AD | Admit: 2018-09-22 | Discharge: 2018-09-24 | DRG: 806 | Disposition: A | Discharge status: Home / Self Care | Payer: Medicaid Other | Attending: Family Medicine | Admitting: Family Medicine

## 2018-09-22 ENCOUNTER — Encounter (HOSPITAL_COMMUNITY): Payer: Self-pay

## 2018-09-22 DIAGNOSIS — B1910 Unspecified viral hepatitis B without hepatic coma: Secondary | ICD-10-CM | POA: Diagnosis present

## 2018-09-22 DIAGNOSIS — O9842 Viral hepatitis complicating childbirth: Principal | ICD-10-CM | POA: Diagnosis present

## 2018-09-22 DIAGNOSIS — Z3A40 40 weeks gestation of pregnancy: Secondary | ICD-10-CM

## 2018-09-22 NOTE — H&P (Addendum)
LABOR AND DELIVERY ADMISSION HISTORY AND PHYSICAL NOTE  Joan Sawyer is a 27 y.o. female G2P1001 with IUP at [redacted]w[redacted]d by LMP/ presenting for SOL.  She reports positive fetal movement. She denies leakage of fluid or vaginal bleeding.  Prenatal History/Complications: PNC at [facility] Pregnancy complications:  - hepatitis B positive   Past Medical History: Past Medical History:  Diagnosis Date  . Hepatitis    Hep B positive  . Hepatitis B   . Hx: UTI (urinary tract infection)     Past Surgical History: Past Surgical History:  Procedure Laterality Date  . NO PAST SURGERIES      Obstetrical History: OB History    Gravida  2   Para  1   Term  1   Preterm  0   AB  0   Living  1     SAB  0   TAB  0   Ectopic  0   Multiple  0   Live Births  1           Social History: Social History   Socioeconomic History  . Marital status: Single    Spouse name: Not on file  . Number of children: Not on file  . Years of education: Not on file  . Highest education level: Not on file  Occupational History  . Not on file  Social Needs  . Financial resource strain: Not on file  . Food insecurity:    Worry: Not on file    Inability: Not on file  . Transportation needs:    Medical: Not on file    Non-medical: Not on file  Tobacco Use  . Smoking status: Never Smoker  . Smokeless tobacco: Never Used  Substance and Sexual Activity  . Alcohol use: No  . Drug use: No  . Sexual activity: Yes    Birth control/protection: None  Lifestyle  . Physical activity:    Days per week: Not on file    Minutes per session: Not on file  . Stress: Not on file  Relationships  . Social connections:    Talks on phone: Not on file    Gets together: Not on file    Attends religious service: Not on file    Active member of club or organization: Not on file    Attends meetings of clubs or organizations: Not on file    Relationship status: Not on file  Other Topics Concern  . Not on file   Social History Narrative  . Not on file    Family History: Family History  Problem Relation Age of Onset  . Healthy Mother   . Healthy Father   . Asthma Brother   . Liver disease Neg Hx     Allergies: No Known Allergies  Medications Prior to Admission  Medication Sig Dispense Refill Last Dose  . Prenatal Vit-Fe Fumarate-FA (MULTIVITAMIN-PRENATAL) 27-0.8 MG TABS tablet Take 1 tablet by mouth daily at 12 noon. 30 each 12 Taking  . tenofovir (VIREAD) 300 MG tablet Take 1 tablet (300 mg total) by mouth daily. Try to take at the same times each day with or without food. 30 tablet 5 Taking     Review of Systems  All systems reviewed and negative except as stated in HPI  Physical Exam Blood pressure 117/82, temperature 97.9 F (36.6 C), resp. rate 20, last menstrual period 12/16/2017, SpO2 100 %, unknown if currently breastfeeding. General appearance: alert, oriented, appears uncomfortable with contractions  Lungs: normal  respiratory effort Heart: regular rate Abdomen: soft, non-tender; gravid, FH appropriate for GA Extremities: No calf swelling or tenderness Presentation: cephalic Fetal monitoring: 140s/mod variability/+ acels/no decels Uterine activity: regular q8116m Dilation: 6.5 Effacement (%): 80 Station: -1 Exam by:: Caprice Renshawebra Callaway, RN   Prenatal labs: ABO, Rh: A/Positive/-- (06/03 1636) Antibody: Negative (06/03 1636) Rubella: 18.90 (06/03 1636) RPR: Non Reactive (10/07 0933)  HBsAg: Confirm. indicated (06/03 1636)  HIV: Non Reactive (10/07 0933)  GC/Chlamydia: negative GBS:  negative  2-hr GTT: 86/72/48 Genetic screening:  Low risk Anatomy US: normal   Prenatal Transfer Tool  Maternal Diabetes: No Genetic Screening: Normal Maternal Ultrasounds/Referrals: Normal Fetal Ultrasounds or other Referrals:  None Maternal Substance Abuse:  No Significant Maternal Medications:  Meds include: Other:  tenofovir Significant Maternal Lab Results: Lab values include:  HBsAG positive  No results found for this or any previous visit (from the past 24 hour(s)).  Patient Active Problem List   Diagnosis Date Noted  . Supervision of other normal pregnancy, antepartum 03/16/2018  . Hepatitis B surface antigen positive 07/05/2014    Assessment: Joan Sawyer is a 27 y.o. G2P1001 at 7017w0d here for SOL  #Labor: expectant management  #Pain: Desires unmedicated birth  #FWB: Cat 1 strip  #ID:  GBS negative; hepatitis B positive treated during pregnancy with tenofovir #MOF: both #MOC: nexplanon #Circ:  No #Hep B: plan for liver US postpartum   Gwenevere AbbotNimeka Wadie Liew, MD Ob Fellow 09/22/2018, 11:55 PM

## 2018-09-22 NOTE — MAU Note (Signed)
Pt states ctx's started at 1600 this afternoon. Pt states ctx's are 5 minutes apart.  Reports +FM   Denies vaginal bleeding.

## 2018-09-23 ENCOUNTER — Encounter (HOSPITAL_COMMUNITY): Payer: Self-pay

## 2018-09-23 DIAGNOSIS — Z3A40 40 weeks gestation of pregnancy: Secondary | ICD-10-CM

## 2018-09-23 DIAGNOSIS — O9842 Viral hepatitis complicating childbirth: Secondary | ICD-10-CM

## 2018-09-23 DIAGNOSIS — Z3483 Encounter for supervision of other normal pregnancy, third trimester: Secondary | ICD-10-CM | POA: Diagnosis present

## 2018-09-23 DIAGNOSIS — B1910 Unspecified viral hepatitis B without hepatic coma: Secondary | ICD-10-CM

## 2018-09-23 LAB — CBC
HCT: 34.5 % — ABNORMAL LOW (ref 36.0–46.0)
Hemoglobin: 11.1 g/dL — ABNORMAL LOW (ref 12.0–15.0)
MCH: 28.6 pg (ref 26.0–34.0)
MCHC: 32.2 g/dL (ref 30.0–36.0)
MCV: 88.9 fL (ref 80.0–100.0)
Platelets: 288 10*3/uL (ref 150–400)
RBC: 3.88 MIL/uL (ref 3.87–5.11)
RDW: 15.7 % — ABNORMAL HIGH (ref 11.5–15.5)
WBC: 7.2 10*3/uL (ref 4.0–10.5)
nRBC: 0 % (ref 0.0–0.2)

## 2018-09-23 LAB — RPR: RPR: NONREACTIVE

## 2018-09-23 LAB — TYPE AND SCREEN
ABO/RH(D): A POS
Antibody Screen: NEGATIVE

## 2018-09-23 MED ORDER — SOD CITRATE-CITRIC ACID 500-334 MG/5ML PO SOLN: 30.0000 mL | ORAL | Status: DC | PRN

## 2018-09-23 MED ORDER — WITCH HAZEL-GLYCERIN EX PADS: 1.0000 "application " | MEDICATED_PAD | CUTANEOUS | Status: DC | PRN

## 2018-09-23 MED ORDER — ACETAMINOPHEN 325 MG PO TABS: 650.0000 mg | ORAL_TABLET | ORAL | Status: DC | PRN

## 2018-09-23 MED ORDER — LACTATED RINGERS IV SOLN: 500.0000 mL | INTRAVENOUS | Status: DC | PRN

## 2018-09-23 MED ORDER — LIDOCAINE HCL (PF) 1 % IJ SOLN
INTRAMUSCULAR | Status: AC
  Filled 2018-09-23: qty 30

## 2018-09-23 MED ORDER — DIPHENHYDRAMINE HCL 25 MG PO CAPS: 25.0000 mg | ORAL_CAPSULE | Freq: Four times a day (QID) | ORAL | Status: DC | PRN

## 2018-09-23 MED ORDER — BENZOCAINE-MENTHOL 20-0.5 % EX AERO
1.0000 "application " | INHALATION_SPRAY | CUTANEOUS | Status: DC | PRN
  Administered 2018-09-23: 1 via TOPICAL
  Filled 2018-09-23: qty 56

## 2018-09-23 MED ORDER — OXYCODONE-ACETAMINOPHEN 5-325 MG PO TABS: 2.0000 | ORAL_TABLET | ORAL | Status: DC | PRN

## 2018-09-23 MED ORDER — ONDANSETRON HCL 4 MG/2ML IJ SOLN: 4.0000 mg | Freq: Four times a day (QID) | INTRAMUSCULAR | Status: DC | PRN

## 2018-09-23 MED ORDER — TETANUS-DIPHTH-ACELL PERTUSSIS 5-2.5-18.5 LF-MCG/0.5 IM SUSP: 0.5000 mL | Freq: Once | INTRAMUSCULAR | Status: DC

## 2018-09-23 MED ORDER — OXYCODONE-ACETAMINOPHEN 5-325 MG PO TABS: 1.0000 | ORAL_TABLET | ORAL | Status: DC | PRN

## 2018-09-23 MED ORDER — IBUPROFEN 600 MG PO TABS
600.0000 mg | ORAL_TABLET | Freq: Four times a day (QID) | ORAL | Status: DC
  Administered 2018-09-23 – 2018-09-24 (×6): 600 mg via ORAL
  Filled 2018-09-23 (×6): qty 1

## 2018-09-23 MED ORDER — ONDANSETRON HCL 4 MG PO TABS: 4.0000 mg | ORAL_TABLET | ORAL | Status: DC | PRN

## 2018-09-23 MED ORDER — SENNOSIDES-DOCUSATE SODIUM 8.6-50 MG PO TABS
2.0000 | ORAL_TABLET | ORAL | Status: DC
  Administered 2018-09-23: 2 via ORAL
  Filled 2018-09-23: qty 2

## 2018-09-23 MED ORDER — ACETAMINOPHEN 325 MG PO TABS
650.0000 mg | ORAL_TABLET | ORAL | Status: DC | PRN
  Administered 2018-09-23: 650 mg via ORAL
  Filled 2018-09-23: qty 2

## 2018-09-23 MED ORDER — TERBUTALINE SULFATE 1 MG/ML IJ SOLN
0.2500 mg | Freq: Once | INTRAMUSCULAR | Status: DC | PRN
  Filled 2018-09-23: qty 1

## 2018-09-23 MED ORDER — MEASLES, MUMPS & RUBELLA VAC IJ SOLR
0.5000 mL | Freq: Once | INTRAMUSCULAR | Status: DC
  Filled 2018-09-23: qty 0.5

## 2018-09-23 MED ORDER — LIDOCAINE HCL (PF) 1 % IJ SOLN
30.0000 mL | INTRAMUSCULAR | Status: DC | PRN
  Filled 2018-09-23: qty 30

## 2018-09-23 MED ORDER — PRENATAL MULTIVITAMIN CH
1.0000 | ORAL_TABLET | Freq: Every day | ORAL | Status: DC
  Administered 2018-09-23 – 2018-09-24 (×2): 1 via ORAL
  Filled 2018-09-23 (×2): qty 1

## 2018-09-23 MED ORDER — OXYTOCIN 40 UNITS IN LACTATED RINGERS INFUSION - SIMPLE MED
INTRAVENOUS | Status: AC
  Filled 2018-09-23: qty 1000

## 2018-09-23 MED ORDER — SIMETHICONE 80 MG PO CHEW: 80.0000 mg | CHEWABLE_TABLET | ORAL | Status: DC | PRN

## 2018-09-23 MED ORDER — LIDOCAINE HCL (PF) 1 % IJ SOLN
30.0000 mL | INTRAMUSCULAR | Status: DC | PRN
  Administered 2018-09-23: 30 mL via SUBCUTANEOUS
  Filled 2018-09-23: qty 30

## 2018-09-23 MED ORDER — OXYTOCIN 40 UNITS IN LACTATED RINGERS INFUSION - SIMPLE MED: 2.5000 [IU]/h | INTRAVENOUS | Status: DC

## 2018-09-23 MED ORDER — OXYTOCIN BOLUS FROM INFUSION: 500.0000 mL | Freq: Once | INTRAVENOUS | Status: DC

## 2018-09-23 MED ORDER — COCONUT OIL OIL: 1.0000 "application " | TOPICAL_OIL | Status: DC | PRN

## 2018-09-23 MED ORDER — LACTATED RINGERS IV SOLN
INTRAVENOUS | Status: DC
  Administered 2018-09-23: 01:00:00 via INTRAVENOUS

## 2018-09-23 MED ORDER — LACTATED RINGERS IV SOLN: INTRAVENOUS | Status: DC

## 2018-09-23 MED ORDER — DIBUCAINE 1 % RE OINT: 1.0000 "application " | TOPICAL_OINTMENT | RECTAL | Status: DC | PRN

## 2018-09-23 MED ORDER — FENTANYL CITRATE (PF) 100 MCG/2ML IJ SOLN: 100.0000 ug | INTRAMUSCULAR | Status: DC | PRN

## 2018-09-23 MED ORDER — ONDANSETRON HCL 4 MG/2ML IJ SOLN: 4.0000 mg | INTRAMUSCULAR | Status: DC | PRN

## 2018-09-23 MED ORDER — OXYTOCIN BOLUS FROM INFUSION
500.0000 mL | Freq: Once | INTRAVENOUS | Status: AC
  Administered 2018-09-23: 500 mL via INTRAVENOUS

## 2018-09-23 NOTE — Plan of Care (Signed)
  Problem: Education: Goal: Knowledge of condition will improve Note:  Used [employer] video Burmese interpreter "MaLeLe" # [id number] for morning assessment and questions. Patient stated she did not have enough milk for baby so she is giving formula also. Discussed with patient about supply and demand and encouraged patient to always place baby at the breast first before giving formula. Encouraged patient to call out when breast feeding in order for staff to see latch. Discussed baby's small stomach size and the fact that baby does not need much in order for him to get enough breast milk. Also ordered patient's breakfast and lunch. Patient has no complaints other than some perineal soreness. Provided tylenol in which patient stated that tylenol was effective. Encouraged the use of dermoplast spray as well. Earl Galasborne, Linda HedgesStefanie Big RockHudspeth

## 2018-09-23 NOTE — Lactation Note (Addendum)
This note was copied from a baby's chart. Lactation Consultation Note  Patient Name: Boy Joan HashimotoChan Gehling WUJWJ'XToday's Date: 09/23/2018   G2P2 vag delivery full term infant.  Mom had planned to do both breastfeeding and formula feeding.Used interpreter on a stick, Joan Sawyer Id number [id number].  Mom reports that she breastfed with her first baby a few weeks but had to give formula in the beginning because she had no milk. Baby 11 hours old, 0 weight loss, mom hep b positive. Mom reports her breasts and nipples got sore during pregnancy.  Mom does report getting more milk and having breast fullness with her last baby.  Mom breastfeeding on arrival.  Infant fell asleep while reviewing handouts with mom. Showed mom how he was still tight and tense, fists closed and discussed with her how to know when he is finished.  Urged mom to try and burp him and offer the other breast. Infant Mom wants to put him in cradle hold and tuck his head in.  Assist with cradle hold letting his head rest on her arm.  Gave and Reviewed [employer] handouts in Burmese. Urged to feed on cue and 8 or more times in 24 hours. Urged to only give formula if medically necessary.  Urged mom to keep track of the voids/stools/and all feedings.  Urged mom to follow up with lactation as needed.  Maternal Data    Feeding Feeding Type: Breast Fed  LATCH Score Latch: Grasps breast easily, tongue down, lips flanged, rhythmical sucking.  Audible Swallowing: A few with stimulation  Type of Nipple: Everted at rest and after stimulation  Comfort (Breast/Nipple): Filling, red/small blisters or bruises, mild/mod discomfort  Hold (Positioning): Assistance needed to correctly position infant at breast and maintain latch.  LATCH Score: 7  Interventions    Lactation Tools Discussed/Used     Consult Status      [name] 09/23/2018, 1:16 PM

## 2018-09-23 NOTE — Progress Notes (Signed)
Interpreter used for admission

## 2018-09-23 NOTE — Discharge Summary (Addendum)
Postpartum Discharge Summary     Patient Name: Joan Sawyer Hyle DOB: 03/02/1991 MRN: [id number]  Date of admission: 09/22/2018 Delivering Provider: Gwenevere AbbotPHILLIP, NIMEKA   Date of discharge: 09/24/2018  Admitting diagnosis: 40WKS CTX Intrauterine pregnancy: 4790w1d     Secondary diagnosis:  Active Problems:   Normal labor   Indication for care in labor and delivery, antepartum   Postpartum care following vaginal delivery  Additional problems:  Hepatitis B Positive     Discharge diagnosis: Term Pregnancy Delivered                                                                                                Post partum procedures:None  Augmentation: AROM  Complications: None  Hospital course:  Onset of Labor With Vaginal Delivery     27 y.o. yo G2P1001 at 5290w1d was admitted in Active Labor on 09/22/2018. Patient had an uncomplicated labor course as follows:  Membrane Rupture Time/Date: 12:57 AM ,09/23/2018   Intrapartum Procedures: Episiotomy: None [1]                                         Lacerations:  2nd degree [3];Perineal [11]  Patient had a delivery of a Viable infant. 09/23/2018  Information for the patient's newborn:  Randel Piggye, Boy Anasofia [[id number]][[id number]]  Delivery Method: Vaginal, Spontaneous(Filed from Delivery Summary)    Pateint had an uncomplicated postpartum course.  She is ambulating, tolerating a regular diet, passing flatus, and urinating well. Patient is discharged home in stable condition on 09/24/18.   Magnesium Sulfate recieved: No BMZ received: No  Physical exam  Vitals:   09/23/18 1200 09/23/18 1530 09/23/18 2338 09/24/18 0550  BP: 114/81 116/73 (!) 121/93 119/89  Pulse: 84 88 90 76  Resp: 18  18 18   Temp: 97.9 F (36.6 C) 98.2 F (36.8 C) 97.7 F (36.5 C) 98.3 F (36.8 C)  TempSrc: Oral  Oral Oral  SpO2:      Weight:      Height:       General: alert, cooperative and no distress Lochia: appropriate Uterine Fundus: firm Incision: N/A DVT Evaluation: No  evidence of DVT seen on physical exam. Negative Homan's sign. No cords or calf tenderness. No significant calf/ankle edema. Labs: Lab Results  Component Value Date   WBC 7.2 09/23/2018   HGB 11.1 (L) 09/23/2018   HCT 34.5 (L) 09/23/2018   MCV 88.9 09/23/2018   PLT 288 09/23/2018   CMP Latest Ref Rng & Units 08/10/2018  Glucose 65 - 99 mg/dL 272(Z119(H)  BUN 7 - 25 mg/dL 6(L)  Creatinine 3.660.50 - 1.10 mg/dL 4.40(H0.42(L)  Sodium 474135 - 259146 mmol/L 136  Potassium 3.5 - 5.3 mmol/L 3.6  Chloride 98 - 110 mmol/L 106  CO2 20 - 32 mmol/L 24  Calcium 8.6 - 10.2 mg/dL 8.3(L)  Total Protein 6.1 - 8.1 g/dL 6.0(L)  Total Bilirubin 0.2 - 1.2 mg/dL 0.6  Alkaline Phos 38 - 126 U/L -  AST 10 - 30 U/L 27  ALT  6 - 29 U/L 33(H)    Discharge instruction: per After Visit Summary and "Baby and Me Booklet".  After visit meds:  Allergies as of 09/24/2018   No Known Allergies     Medication List    TAKE these medications   ibuprofen 600 MG tablet Commonly known as:  ADVIL,MOTRIN Take 1 tablet (600 mg total) by mouth every 6 (six) hours.   multivitamin-prenatal 27-0.8 MG Tabs tablet Take 1 tablet by mouth daily at 12 noon.   senna-docusate 8.6-50 MG tablet Commonly known as:  Senokot-S Take 2 tablets by mouth daily. Start taking on:  September 25, 2018   tenofovir 300 MG tablet Commonly known as:  VIREAD Take 1 tablet (300 mg total) by mouth daily. Try to take at the same times each day with or without food.       Diet: routine diet  Activity: Advance as tolerated. Pelvic rest for 6 weeks.   Outpatient follow up:4 weeks Follow up Appt: Future Appointments  Date Time Provider Department Center  09/29/2018  7:00 AM [facility]ED ROOM [facility]ED None  10/20/2018 10:55 AM Marylene Land, CNM [facility] WOC  11/17/2018  9:30 AM Blanchard Kelch, NP [facility]   Follow up Visit:   Please schedule this patient for Postpartum visit in: 4 weeks with the following provider: Any  provider For C/S patients schedule nurse incision check in weeks 2 weeks: no Low risk pregnancy complicated by: None Delivery mode:  SVD Anticipated Birth Control:  Nexplanon PP Procedures needed: Liver US PP for HepB+  Schedule Integrated BH visit: no  Newborn Data: Live born female  Birth Weight: 8 lb 13.8 oz (4020 g) APGAR: 9, 9  Newborn Delivery   Birth date/time:  09/23/2018 01:19:00 Delivery type:  Vaginal, Spontaneous     Baby Feeding: Bottle and Breast Disposition:home with mother   09/24/2018 Awilda Metro, MD  I confirm that I have verified the information documented in the resident's note and that I have also personally reperformed the physical exam and all medical decision making activities. Patient was seen and examined by me also Agree with note Vitals stable Labs stable Fundus firm, lochia within normal limits Perineum healing Ext WNL Continue care Ready for discharge  Aviva Signs, CNM

## 2018-09-24 ENCOUNTER — Ambulatory Visit: Payer: Self-pay

## 2018-09-24 ENCOUNTER — Other Ambulatory Visit: Payer: Medicaid Other

## 2018-09-24 ENCOUNTER — Encounter (HOSPITAL_COMMUNITY): Payer: Self-pay | Admitting: *Deleted

## 2018-09-24 ENCOUNTER — Encounter: Payer: Medicaid Other | Admitting: Obstetrics and Gynecology

## 2018-09-24 MED ORDER — IBUPROFEN 600 MG PO TABS: 600.0000 mg | ORAL_TABLET | Freq: Four times a day (QID) | ORAL | 0 refills | Status: AC

## 2018-09-24 MED ORDER — SENNOSIDES-DOCUSATE SODIUM 8.6-50 MG PO TABS: 2.0000 | ORAL_TABLET | ORAL | 0 refills | Status: AC

## 2018-09-24 NOTE — Lactation Note (Signed)
This note was copied from a baby's chart. Lactation Consultation Note  Patient Name: Joan Sawyer ZOXWR'UToday's Date: 09/24/2018 Reason for consult: Follow-up assessment;Infant weight loss;Other (Comment);Term(limited English - Pacific Burmese - [phone] )  Per Vivi MartensAshley [facility] mom has been latching on the left breast mostly in a cross cradle to a laid back position and DL on the right in the sane position.  Mom,dad, baby and family member are ready to be D/C and baby not showing feeding cues.  LC verbally made breast feeding position recommendations. Mom mentioned initially she is tender to latch and improves with swallows. LC recommended so the soreness doesn't increase - prior  To latch - breast massage,hand express, pre-pump and then latch until the soreness improves.  Sore nipple and engorgement prevention and tx reviewed.  LC stressed the importance of STS feedings until the baby is back to birth weight and gaining steadily . If the milk is in and the baby is only feeding on the 1st breast , release 2nd breast down to comfort if the breast is really full.  Mother informed of post-discharge support and given phone number to the lactation department, including services for phone call assistance; out-patient appointments; and breastfeeding support group. List of other breastfeeding resources in the community given in the handout. Encouraged mother to call for problems or concerns related to breastfeeding.   Maternal Data    Feeding    LATCH Score                   Interventions Interventions: Breast feeding basics reviewed  Lactation Tools Discussed/Used Pump Review: Setup, frequency, and cleaning;Milk Storage   Consult Status Consult Status: Complete Date: 09/24/18    Joan Sawyer 09/24/2018, 1:26 PM

## 2018-09-29 ENCOUNTER — Inpatient Hospital Stay (HOSPITAL_COMMUNITY): Admission: RE | Admit: 2018-09-29 | Payer: Medicaid Other | Source: Ambulatory Visit

## 2018-10-20 ENCOUNTER — Ambulatory Visit: Payer: Medicaid Other | Admitting: Student

## 2018-10-20 ENCOUNTER — Encounter: Payer: Self-pay | Admitting: *Deleted

## 2018-11-16 ENCOUNTER — Encounter: Payer: Self-pay | Admitting: Family Medicine

## 2018-11-16 ENCOUNTER — Ambulatory Visit: Payer: Medicaid Other | Admitting: Obstetrics & Gynecology

## 2018-11-17 ENCOUNTER — Ambulatory Visit: Payer: Medicaid Other | Admitting: Infectious Diseases

## 2019-08-16 ENCOUNTER — Encounter: Payer: Self-pay | Admitting: Family Medicine

## 2019-08-16 ENCOUNTER — Ambulatory Visit (INDEPENDENT_AMBULATORY_CARE_PROVIDER_SITE_OTHER): Payer: Medicaid Other | Admitting: Family Medicine

## 2019-08-16 ENCOUNTER — Other Ambulatory Visit: Payer: Self-pay

## 2019-08-16 VITALS — BP 124/82 | HR 82

## 2019-08-16 DIAGNOSIS — Z30013 Encounter for initial prescription of injectable contraceptive: Secondary | ICD-10-CM | POA: Diagnosis present

## 2019-08-16 DIAGNOSIS — Z3200 Encounter for pregnancy test, result unknown: Secondary | ICD-10-CM

## 2019-08-16 DIAGNOSIS — Z3042 Encounter for surveillance of injectable contraceptive: Secondary | ICD-10-CM | POA: Diagnosis not present

## 2019-08-16 DIAGNOSIS — M545 Low back pain, unspecified: Secondary | ICD-10-CM

## 2019-08-16 LAB — POCT URINE PREGNANCY: Preg Test, Ur: NEGATIVE

## 2019-08-16 MED ORDER — MEDROXYPROGESTERONE ACETATE 150 MG/ML IM SUSP: 150.0000 mg | Freq: Once | INTRAMUSCULAR | 0 refills | Status: DC

## 2019-08-16 MED ORDER — MEDROXYPROGESTERONE ACETATE 150 MG/ML IM SUSP
150.0000 mg | Freq: Once | INTRAMUSCULAR | Status: AC
  Administered 2019-08-16: 12:00:00 150 mg via INTRAMUSCULAR

## 2019-08-16 NOTE — Progress Notes (Signed)
   Subjective:    Patient ID: Joan Sawyer, female    DOB: 1991/08/22, 28 y.o.   MRN: [id number]   Virtual Burmese interpretor used for this visit   CC: [name] is a [id number] 28 yr old female who is presents for her first PCP visit and for nexplanon  HPI: Pt would like an Nexplanon implant today. Used nexplanon previously after birth of first child and had no issues. Is sexually active with husband only and currently not using birth control. Plans to have more children in 3-4 years time. Has 2 children 10 months and 46 yrs old and is enjoying motherhood. LMP 28th October. Usually regular menstrual cycles last 3-4 days. No dysmenorrhea or menorrhagia. Pt denies previous STDs and denies that partner has STDs. Last Pap smear was March 2019 and normal.   PMH Hepatitis B dx when pregnant-Sees ID doctor No hospital admissions   [place] None  DH  NKDA Tenofovir   FH Brother has asthma   SH Does not currently work Lives with 2 children and husband  Nil smoking, never smoked Nil ETOH Diet consists of traditional Burmese food: rice, soup and curry  Does not exercise but leads active lifestyle with 2 children  Smoking status reviewed   ROS: pertinent noted in the HPI   Past medical history, surgical, family, and social history reviewed and updated in the EMR as appropriate. Reviewed problem list.   Objective:  BP 124/82   Pulse 82   LMP 07/12/2019 (Approximate)   SpO2 99%   Vitals and nursing note reviewed  General: NAD, pleasant, able to participate in exam Cardiac: RRR, S1 S2 present. normal heart sounds, no murmurs. Respiratory: CTAB, normal effort, No wheezes, rales or rhonchi Extremities: no edema or cyanosis. Skin: warm and dry, no rashes noted Neuro: alert, no obvious focal deficits Psych: Normal affect and mood   Assessment & Plan:   Pt had flu vaccine last week Wednesday   Depo-Provera contraceptive status No providers able to insert Nexplanon in clinic today at short  notice. Pros and cons of other birth control such as IUD/pill nprovided but pt was happy to stick with Nexplanon and would like that as her form of contraception for the next few years before trying to conceive her next child. Pt opted for depo provera injection instead today for 3 months (urine bHCG negative) and will return in 3 months time at the clinic to have Nexplanon inserted. Risks of irregular bleeding, weight and mood changes and reduced bone density from depo and nexplanon explained to pt. Pap smear is up to date.   Lattie Haw, MD  [place] PGY-1

## 2019-08-16 NOTE — Assessment & Plan Note (Signed)
No providers able to insert Nexplanon in clinic today at short notice. Pros and cons of other birth control such as IUD/pill nprovided but pt was happy to stick with Nexplanon and would like that as her form of contraception for the next few years before trying to conceive her next child. Pt opted for depo provera injection instead today for 3 months (urine bHCG negative) and will return in 3 months time at the clinic to have Nexplanon inserted. Risks of irregular bleeding, weight and mood changes and reduced bone density from depo and nexplanon explained to pt. Pap smear is up to date.

## 2019-08-16 NOTE — Patient Instructions (Addendum)
Joan Sawyer,  It was lovely to meet with you today and happy to be your new PCP!!  I understand you would wanted a Nexplanon on this visit but today we were unable to do this at short notice.  We instead gave you the Depo injection contraception similar to the Nexplanon but last for 3 months. You could come in in 3 months time for a Nexplanon with a provider that can do these.  Things to look out for are irregular bleeding, weight gain and changes in mood.   Do not hesitate to contact our clinic if you have further question.  I look forward to seeing you again.   Best wishes,  Dr Mamie Nick

## 2019-10-01 NOTE — Progress Notes (Signed)
   Subjective:    Patient ID: Joan Sawyer, female    DOB: [birth year], 28 y.o.   MRN: [id number]  Burmese interpretor used throughout encounter. Burmese interpretor was able to translate consent form for patient prior to signing. Patient also gave verbal consent.   CC: Nexplanon insertion   HPI: Nexplanon insertion  Joan Sawyer is a 28 y.o. year old female here for Nexplanon insertion.  Seen by Dr. Posey Pronto on 08/16/19 at which time Patient requested Nexplanon implant. Previously had this in the past.  Received depo provers on 08/16/19. Patient at that time had desire to have more children but only in 3 to 4 years.  Last Pap smear was in March 2019 and was normal.. Her LMP was unknown (has been on depo so without period for some time) , and her pregnancy test today was negative. Last intercourse was last Thursday, unprotected. Risks/benefits/side effects of Nexplanon have been discussed and her questions have been answered.  Specifically, a failure rate of 10/998 has been reported, with an increased failure rate if pt takes [place] and/or antiseizure medicaitons.  Joan Sawyer is aware of the common side effect of irregular bleeding, which the incidence of decreases over time.   Past Medical History: Past Medical History:  Diagnosis Date  . Hepatitis    Hep B positive  . Hepatitis B   . Hx: UTI (urinary tract infection)     Past Surgical History: Past Surgical History:  Procedure Laterality Date  . NO PAST SURGERIES      Family History: Family History  Problem Relation Age of Onset  . Healthy Mother   . Healthy Father   . Asthma Brother   . Liver disease Neg Hx     Social History: Social History   Tobacco Use  . Smoking status: Never Smoker  . Smokeless tobacco: Never Used  Substance Use Topics  . Alcohol use: No  . Drug use: No    Allergies: No Known Allergies   Objective:  BP 110/80   Pulse 84   Wt 181 lb 6.4 oz (82.3 kg)   SpO2 98%   BMI 35.43 kg/m  Vitals and  nursing note reviewed  General: well nourished, in no acute distress HEENT: normocephalic  Neck: supple  Cardiac: Regular rate Respiratory: no increased work of breathing Extremities: no edema or cyanosis. Warm, well perfused.  Skin: warm and dry, no rashes noted Neuro: alert and oriented, no focal deficits   Assessment & Plan:    Nexplanon insertion Her left arm, approximatly 4 inches proximal from the elbow, was cleansed with alcohol and anesthetized with 4cc of 2% Lidocaine.  The area was cleansed again and the Nexplanon was inserted without difficulty. Initially had some difficulty but with assistance by Dr. Ardelia Mems was able to place. Dr. Ardelia Mems, myself, and patient were able to palpate Nexplanon after insertion. I advised patient to monitor at home as well. A pressure bandage was applied.  Pt was instructed to remove pressure bandage in a few hours, and keep insertion site covered with a bandaid for 3 days.  Symptoms of infection were discussed with patient, advised to follow up if they occur. Follow-up scheduled PRN problems    Return if symptoms worsen or fail to improve.   Caroline More, DO, PGY-3

## 2019-10-04 ENCOUNTER — Other Ambulatory Visit: Payer: Self-pay

## 2019-10-04 ENCOUNTER — Ambulatory Visit: Payer: Medicaid Other | Admitting: Family Medicine

## 2019-10-04 VITALS — BP 110/80 | HR 84 | Wt 181.4 lb

## 2019-10-04 DIAGNOSIS — Z30017 Encounter for initial prescription of implantable subdermal contraceptive: Secondary | ICD-10-CM | POA: Diagnosis present

## 2019-10-04 DIAGNOSIS — Z3046 Encounter for surveillance of implantable subdermal contraceptive: Secondary | ICD-10-CM | POA: Diagnosis not present

## 2019-10-04 DIAGNOSIS — Z30019 Encounter for initial prescription of contraceptives, unspecified: Secondary | ICD-10-CM

## 2019-10-04 LAB — POCT URINE PREGNANCY: Preg Test, Ur: NEGATIVE

## 2019-10-04 MED ORDER — ETONOGESTREL 68 MG ~~LOC~~ IMPL
68.0000 mg | DRUG_IMPLANT | Freq: Once | SUBCUTANEOUS | Status: AC
  Administered 2019-10-04: 17:00:00 68 mg via SUBCUTANEOUS

## 2019-10-04 NOTE — Patient Instructions (Signed)

## 2019-10-06 DIAGNOSIS — Z30017 Encounter for initial prescription of implantable subdermal contraceptive: Secondary | ICD-10-CM | POA: Insufficient documentation

## 2019-10-06 NOTE — Assessment & Plan Note (Addendum)
Her left arm, approximatly 4 inches proximal from the elbow, was cleansed with alcohol and anesthetized with 4cc of 2% Lidocaine.  The area was cleansed again and the Nexplanon was inserted without difficulty. Initially had some difficulty but with assistance by Dr. Ardelia Mems was able to place. Dr. Ardelia Mems, myself, and patient were able to palpate Nexplanon after insertion. I advised patient to monitor at home as well. A pressure bandage was applied.  Pt was instructed to remove pressure bandage in a few hours, and keep insertion site covered with a bandaid for 3 days.  Symptoms of infection were discussed with patient, advised to follow up if they occur. Follow-up scheduled PRN problems

## 2020-03-15 ENCOUNTER — Encounter: Payer: Self-pay | Admitting: *Deleted

## 2020-03-16 NOTE — Congregational Nurse Program (Signed)
  Dept: [phone]   Congregational Nurse Program Note  Date of Encounter: 03/15/2020  Past Medical History: Past Medical History:  Diagnosis Date  . Hepatitis    Hep B positive  . Hepatitis B   . Hx: UTI (urinary tract infection)     Encounter Details: CNP Questionnaire - 03/15/20 1830      Questionnaire   Patient Status  Refugee    Race  Asian    Location Patient Served At  Not Applicable   [facility]    Uninsured  Not Applicable    Food  No food insecurities    Housing/Utilities  Yes, have permanent housing    Transportation  No transportation needs    Interpersonal Safety  Yes, feel physically and emotionally safe where you currently live    Medication  No medication insecurities    Medical Provider  Yes    Referrals  Not Applicable    ED Visit Averted  Not Applicable    Life-Saving Intervention Made  Not Applicable      Client came in to clinic today for routine BP check.  120/86 P 80.  She reports that she had headaches associated with taking birth control pills.  She also reports that she is not menstruating each month when taking the birth control medication.  She last saw an MD a year ago.  She reports that she has a 42 yr old son and a 76 yr old son.  I provided education on birth control pills.  I encouraged her to call MD to discuss absence of monthly menstruation periods.  Educated her that there are other types of birth control pills if a change is needed.  She plans to follow up with MD.  She also reports an increase in weight of about 35 to 40 lbs with current Birth control medication.  Again encouraged to call MD to report current issues.  Roderic Palau, RN, MSN, CNP [phone] Office [phone] Cell

## 2021-03-09 ENCOUNTER — Other Ambulatory Visit: Payer: Self-pay | Admitting: Internal Medicine

## 2021-03-10 LAB — CBC
HCT: 38.7 % (ref 35.0–45.0)
Hemoglobin: 12.2 g/dL (ref 11.7–15.5)
MCH: 28 pg (ref 27.0–33.0)
MCHC: 31.5 g/dL — ABNORMAL LOW (ref 32.0–36.0)
MCV: 89 fL (ref 80.0–100.0)
MPV: 10.7 fL (ref 7.5–12.5)
Platelets: 262 10*3/uL (ref 140–400)
RBC: 4.35 10*6/uL (ref 3.80–5.10)
RDW: 12.8 % (ref 11.0–15.0)
WBC: 5.8 10*3/uL (ref 3.8–10.8)

## 2021-03-10 LAB — COMPLETE METABOLIC PANEL WITH GFR
AG Ratio: 1.3 (calc) (ref 1.0–2.5)
ALT: 28 U/L (ref 6–29)
AST: 17 U/L (ref 10–30)
Albumin: 4 g/dL (ref 3.6–5.1)
Alkaline phosphatase (APISO): 60 U/L (ref 31–125)
BUN: 11 mg/dL (ref 7–25)
CO2: 22 mmol/L (ref 20–32)
Calcium: 8.7 mg/dL (ref 8.6–10.2)
Chloride: 106 mmol/L (ref 98–110)
Creat: 0.59 mg/dL (ref 0.50–1.10)
GFR, Est African American: 144 mL/min/{1.73_m2} (ref >=60)
GFR, Est Non African American: 124 mL/min/{1.73_m2} (ref >=60)
Globulin: 3 g/dL (calc) (ref 1.9–3.7)
Glucose, Bld: 87 mg/dL (ref 65–99)
Potassium: 4.1 mmol/L (ref 3.5–5.3)
Sodium: 137 mmol/L (ref 135–146)
Total Bilirubin: 0.4 mg/dL (ref 0.2–1.2)
Total Protein: 7 g/dL (ref 6.1–8.1)

## 2021-03-10 LAB — LIPID PANEL
Cholesterol: 131 mg/dL (ref <200)
HDL: 42 mg/dL — ABNORMAL LOW (ref >=50)
LDL Cholesterol (Calc): 69 mg/dL (calc)
Non-HDL Cholesterol (Calc): 89 mg/dL (calc) (ref <130)
Total CHOL/HDL Ratio: 3.1 (calc) (ref <5.0)
Triglycerides: 112 mg/dL (ref <150)

## 2021-03-10 LAB — VITAMIN D 25 HYDROXY (VIT D DEFICIENCY, FRACTURES): Vit D, 25-Hydroxy: 38 ng/mL (ref 30–100)

## 2021-03-10 LAB — TSH: TSH: 2.53 mIU/L

## 2022-07-04 ENCOUNTER — Other Ambulatory Visit: Payer: Self-pay

## 2022-07-04 ENCOUNTER — Emergency Department (HOSPITAL_COMMUNITY)
Admission: EM | Admit: 2022-07-04 | Discharge: 2022-07-04 | Disposition: A | Discharge status: Home / Self Care | Payer: Medicaid Other | Attending: Emergency Medicine | Admitting: Emergency Medicine

## 2022-07-04 ENCOUNTER — Encounter (HOSPITAL_COMMUNITY): Payer: Self-pay

## 2022-07-04 ENCOUNTER — Emergency Department (HOSPITAL_COMMUNITY): Payer: Medicaid Other

## 2022-07-04 DIAGNOSIS — M79622 Pain in left upper arm: Secondary | ICD-10-CM | POA: Diagnosis not present

## 2022-07-04 DIAGNOSIS — Y92410 Unspecified street and highway as the place of occurrence of the external cause: Secondary | ICD-10-CM | POA: Insufficient documentation

## 2022-07-04 MED ORDER — ACETAMINOPHEN 325 MG PO TABS
650.0000 mg | ORAL_TABLET | Freq: Once | ORAL | Status: AC
  Administered 2022-07-04: 650 mg via ORAL
  Filled 2022-07-04: qty 2

## 2022-07-04 MED ORDER — ACETAMINOPHEN 325 MG PO TABS
ORAL_TABLET | ORAL | Status: AC
  Filled 2022-07-04: qty 1
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
## 2022-07-04 NOTE — Discharge Instructions (Addendum)
Use tylenol and motrin as needed for pain.

## 2022-09-16 ENCOUNTER — Other Ambulatory Visit: Payer: Self-pay | Admitting: Internal Medicine

## 2022-09-17 LAB — LIPID PANEL
Cholesterol: 127 mg/dL (ref <200)
HDL: 42 mg/dL — ABNORMAL LOW (ref >=50)
LDL Cholesterol (Calc): 67 mg/dL (calc)
Non-HDL Cholesterol (Calc): 85 mg/dL (calc) (ref <130)
Total CHOL/HDL Ratio: 3 (calc) (ref <5.0)
Triglycerides: 95 mg/dL (ref <150)

## 2022-09-17 LAB — CBC
HCT: 37.5 % (ref 35.0–45.0)
Hemoglobin: 12.4 g/dL (ref 11.7–15.5)
MCH: 30 pg (ref 27.0–33.0)
MCHC: 33.1 g/dL (ref 32.0–36.0)
MCV: 90.8 fL (ref 80.0–100.0)
MPV: 10.9 fL (ref 7.5–12.5)
Platelets: 270 10*3/uL (ref 140–400)
RBC: 4.13 10*6/uL (ref 3.80–5.10)
RDW: 12.4 % (ref 11.0–15.0)
WBC: 5.5 10*3/uL (ref 3.8–10.8)

## 2022-09-17 LAB — COMPLETE METABOLIC PANEL WITH GFR
AG Ratio: 1.3 (calc) (ref 1.0–2.5)
ALT: 29 U/L (ref 6–29)
AST: 16 U/L (ref 10–30)
Albumin: 4 g/dL (ref 3.6–5.1)
Alkaline phosphatase (APISO): 43 U/L (ref 31–125)
BUN: 11 mg/dL (ref 7–25)
CO2: 24 mmol/L (ref 20–32)
Calcium: 8.9 mg/dL (ref 8.6–10.2)
Chloride: 105 mmol/L (ref 98–110)
Creat: 0.64 mg/dL (ref 0.50–0.97)
Globulin: 3.1 g/dL (calc) (ref 1.9–3.7)
Glucose, Bld: 65 mg/dL (ref 65–99)
Potassium: 4.1 mmol/L (ref 3.5–5.3)
Sodium: 138 mmol/L (ref 135–146)
Total Bilirubin: 0.4 mg/dL (ref 0.2–1.2)
Total Protein: 7.1 g/dL (ref 6.1–8.1)
eGFR: 122 mL/min/{1.73_m2} (ref >=60)

## 2022-09-17 LAB — VITAMIN D 25 HYDROXY (VIT D DEFICIENCY, FRACTURES): Vit D, 25-Hydroxy: 26 ng/mL — ABNORMAL LOW (ref 30–100)

## 2022-11-08 ENCOUNTER — Encounter: Payer: Self-pay | Admitting: Obstetrics & Gynecology

## 2022-11-08 ENCOUNTER — Ambulatory Visit (INDEPENDENT_AMBULATORY_CARE_PROVIDER_SITE_OTHER): Payer: Medicaid Other | Admitting: Obstetrics & Gynecology

## 2022-11-08 ENCOUNTER — Other Ambulatory Visit (HOSPITAL_COMMUNITY)
Admission: RE | Admit: 2022-11-08 | Discharge: 2022-11-08 | Disposition: A | Discharge status: Home / Self Care | Payer: Medicaid Other | Source: Ambulatory Visit | Attending: Obstetrics & Gynecology | Admitting: Obstetrics & Gynecology

## 2022-11-08 VITALS — BP 113/77 | HR 80 | Ht 65.0 in | Wt 178.0 lb

## 2022-11-08 DIAGNOSIS — Z3046 Encounter for surveillance of implantable subdermal contraceptive: Secondary | ICD-10-CM | POA: Diagnosis not present

## 2022-11-08 DIAGNOSIS — Z01419 Encounter for gynecological examination (general) (routine) without abnormal findings: Secondary | ICD-10-CM

## 2022-11-08 MED ORDER — ETONOGESTREL 68 MG ~~LOC~~ IMPL
68.0000 mg | DRUG_IMPLANT | Freq: Once | SUBCUTANEOUS | Status: AC
  Administered 2022-11-08: 68 mg via SUBCUTANEOUS

## 2022-11-08 NOTE — Progress Notes (Signed)
GYN Annual Pap due Nexplanon removal and reinsertion

## 2022-11-08 NOTE — Progress Notes (Signed)
Patient ID: Joan Sawyer, female   DOB: April 29, 1991, 32 y.o.   MRN: [id number]  Chief Complaint  Patient presents with   Gynecologic Exam   Nexplanon removal/ reinsertion    HPI Joan Sawyer is a 32 y.o. female.  [id number] Patient's last menstrual period was 10/10/2022. She has done well with Nexplanon for 3 years and requests replacement today HPI  Past Medical History:  Diagnosis Date   Hepatitis    Hep B positive   Hepatitis B    Hx: UTI (urinary tract infection)     Past Surgical History:  Procedure Laterality Date   NO PAST SURGERIES      Family History  Problem Relation Age of Onset   Healthy Mother    Healthy Father    Asthma Brother    Liver disease Neg Hx     Social History Social History   Tobacco Use   Smoking status: Never   Smokeless tobacco: Never  Vaping Use   Vaping Use: Never used  Substance Use Topics   Alcohol use: No   Drug use: No    No Known Allergies  Current Outpatient Medications  Medication Sig Dispense Refill   cetirizine (ZYRTEC) 10 MG tablet Take 10 mg by mouth daily.     VENTOLIN HFA 108 (90 Base) MCG/ACT inhaler Inhale 2 puffs into the lungs every 6 (six) hours as needed.     ibuprofen (ADVIL,MOTRIN) 600 MG tablet Take 1 tablet (600 mg total) by mouth every 6 (six) hours. 30 tablet 0   Prenatal Vit-Fe Fumarate-FA (MULTIVITAMIN-PRENATAL) 27-0.8 MG TABS tablet Take 1 tablet by mouth daily at 12 noon. 30 each 12   senna-docusate (SENOKOT-S) 8.6-50 MG tablet Take 2 tablets by mouth daily. 14 tablet 0   tenofovir (VIREAD) 300 MG tablet Take 1 tablet (300 mg total) by mouth daily. Try to take at the same times each day with or without food. 30 tablet 5   Current Facility-Administered Medications  Medication Dose Route Frequency Provider Last Rate Last Admin   etonogestrel (NEXPLANON) implant 68 mg  68 mg Subdermal Once Woodroe Mode, MD        Review of Systems Review of Systems  Constitutional: Negative.   Respiratory: Negative.     Cardiovascular: Negative.   Gastrointestinal: Negative.   Endocrine: Negative.   Genitourinary:  Negative for menstrual problem, pelvic pain, vaginal bleeding and vaginal discharge.    Blood pressure 113/77, pulse 80, height 5\' 5"  (1.651 m), weight 178 lb (80.7 kg), last menstrual period 10/10/2022, unknown if currently breastfeeding.  Physical Exam Physical Exam Exam conducted with a chaperone present.  Constitutional:      Appearance: Normal appearance.  HENT:     Head: Normocephalic and atraumatic.  Cardiovascular:     Rate and Rhythm: Normal rate.  Pulmonary:     Effort: Pulmonary effort is normal.  Chest:  Breasts:    Right: Normal.     Left: Normal.  Genitourinary:    General: Normal vulva.     Exam position: Lithotomy position.     Vagina: Normal.     Cervix: Normal.     Uterus: Normal.      Adnexa: Right adnexa normal and left adnexa normal.  Musculoskeletal:     Cervical back: Normal range of motion and neck supple.  Skin:    General: Skin is warm and dry.  Neurological:     Mental Status: She is alert.     Data Reviewed Pap result  Assessment  Encounter for removal and reinsertion of Nexplanon - Plan: etonogestrel (NEXPLANON) implant 68 mg  Well woman exam with routine gynecological exam - Plan: Cytology - PAP Doing well  Plan No orders of the defined types were placed in this encounter.  Meds ordered this encounter  Medications   etonogestrel (NEXPLANON) implant 68 mg   RTC 1 year    Emeterio Reeve 11/08/2022, 11:05 AM

## 2022-11-08 NOTE — Progress Notes (Signed)
GYNECOLOGY OFFICE PROCEDURE NOTE  Joan Sawyer is a 32 y.o. [id number] here for Nexplanon removal and Nexplanon insertion.  Last pap smear was on 2019 and was normal.  No other gynecologic concerns.    Nexplanon Removal and Insertion  Patient identified, informed consent performed, consent signed.   Patient does understand that irregular bleeding is a very common side effect of this medication. She was advised to have backup contraception for one week after replacement of the implant. Pregnancy test in clinic today was negative.  Appropriate time out taken. Implanon site identified. Area prepped in usual sterile fashon. One ml of 1% lidocaine was used to anesthetize the area at the distal end of the implant. A small stab incision was made right beside the implant on the distal portion. The Nexplanon rod was grasped using hemostats and removed without difficulty. There was minimal blood loss. There were no complications. Area was then injected with 3 ml of 1 % lidocaine. She was re-prepped with betadine, Nexplanon removed from packaging, Device confirmed in needle, then inserted full length of needle and withdrawn per handbook instructions. Nexplanon was able to palpated in the patient's arm; patient palpated the insert herself.  There was minimal blood loss. Patient insertion site covered with gauze and a pressure bandage to reduce any bruising. The patient tolerated the procedure well and was given post procedure instructions.  She was advised to have backup contraception for one week.    Woodroe Mode, MD Attending Obstetrician & Gynecologist, [facility]  11/08/2022

## 2022-11-12 LAB — CYTOLOGY - PAP
Comment: NEGATIVE
Diagnosis: NEGATIVE
High risk HPV: NEGATIVE

## 2022-11-19 NOTE — Progress Notes (Signed)
Normal pap

## 2024-05-12 ENCOUNTER — Emergency Department (HOSPITAL_COMMUNITY)

## 2024-05-12 ENCOUNTER — Emergency Department (HOSPITAL_COMMUNITY)
Admission: EM | Admit: 2024-05-12 | Discharge: 2024-05-12 | Disposition: A | Discharge status: Home / Self Care | Attending: Emergency Medicine | Admitting: Emergency Medicine

## 2024-05-12 ENCOUNTER — Other Ambulatory Visit: Payer: Self-pay

## 2024-05-12 DIAGNOSIS — N12 Tubulo-interstitial nephritis, not specified as acute or chronic: Secondary | ICD-10-CM | POA: Insufficient documentation

## 2024-05-12 DIAGNOSIS — N83202 Unspecified ovarian cyst, left side: Secondary | ICD-10-CM | POA: Insufficient documentation

## 2024-05-12 DIAGNOSIS — R1032 Left lower quadrant pain: Secondary | ICD-10-CM | POA: Diagnosis present

## 2024-05-12 LAB — COMPREHENSIVE METABOLIC PANEL WITH GFR
ALT: 52 U/L — ABNORMAL HIGH (ref 0–44)
AST: 31 U/L (ref 15–41)
Albumin: 3.5 g/dL (ref 3.5–5.0)
Alkaline Phosphatase: 49 U/L (ref 38–126)
Anion gap: 10 (ref 5–15)
BUN: 10 mg/dL (ref 6–20)
CO2: 20 mmol/L — ABNORMAL LOW (ref 22–32)
Calcium: 8.6 mg/dL — ABNORMAL LOW (ref 8.9–10.3)
Chloride: 105 mmol/L (ref 98–111)
Creatinine, Ser: 0.69 mg/dL (ref 0.44–1.00)
GFR, Estimated: >60 mL/min (ref >60)
Glucose, Bld: 118 mg/dL — ABNORMAL HIGH (ref 70–99)
Potassium: 3.8 mmol/L (ref 3.5–5.1)
Sodium: 135 mmol/L (ref 135–145)
Total Bilirubin: 0.9 mg/dL (ref 0.0–1.2)
Total Protein: 6.6 g/dL (ref 6.5–8.1)

## 2024-05-12 LAB — LIPASE, BLOOD: Lipase: 36 U/L (ref 11–51)

## 2024-05-12 LAB — URINALYSIS, ROUTINE W REFLEX MICROSCOPIC
Bilirubin Urine: NEGATIVE
Glucose, UA: NEGATIVE mg/dL
Ketones, ur: NEGATIVE mg/dL
Nitrite: NEGATIVE
Protein, ur: 100 mg/dL — AB
RBC / HPF: >50 RBC/hpf (ref 0–5)
Specific Gravity, Urine: 1.012 (ref 1.005–1.030)
WBC, UA: >50 WBC/hpf (ref 0–5)
pH: 8 (ref 5.0–8.0)

## 2024-05-12 LAB — CBC
HCT: 37.8 % (ref 36.0–46.0)
Hemoglobin: 12.6 g/dL (ref 12.0–15.0)
MCH: 30.1 pg (ref 26.0–34.0)
MCHC: 33.3 g/dL (ref 30.0–36.0)
MCV: 90.2 fL (ref 80.0–100.0)
Platelets: 287 K/uL (ref 150–400)
RBC: 4.19 MIL/uL (ref 3.87–5.11)
RDW: 13.2 % (ref 11.5–15.5)
WBC: 10.3 K/uL (ref 4.0–10.5)
nRBC: 0 % (ref 0.0–0.2)

## 2024-05-12 LAB — HCG, SERUM, QUALITATIVE: Preg, Serum: NEGATIVE

## 2024-05-12 MED ORDER — CEFPODOXIME PROXETIL 200 MG PO TABS: 200.0000 mg | ORAL_TABLET | Freq: Two times a day (BID) | ORAL | 0 refills | Status: DC

## 2024-05-12 MED ORDER — ONDANSETRON HCL 4 MG/2ML IJ SOLN
4.0000 mg | Freq: Once | INTRAMUSCULAR | Status: AC
Start: 2024-05-12 — End: 2024-05-12
  Administered 2024-05-12: 4 mg via INTRAVENOUS
  Filled 2024-05-12: qty 2

## 2024-05-12 MED ORDER — KETOROLAC TROMETHAMINE 15 MG/ML IJ SOLN
15.0000 mg | Freq: Once | INTRAMUSCULAR | Status: AC
  Administered 2024-05-12: 15 mg via INTRAVENOUS
  Filled 2024-05-12: qty 1

## 2024-05-12 MED ORDER — ACETAMINOPHEN 325 MG PO TABS
650.0000 mg | ORAL_TABLET | Freq: Once | ORAL | Status: AC
  Administered 2024-05-12: 650 mg via ORAL
  Filled 2024-05-12: qty 2

## 2024-05-12 MED ORDER — SODIUM CHLORIDE 0.9 % IV SOLN
2.0000 g | Freq: Once | INTRAVENOUS | Status: AC
  Administered 2024-05-12: 2 g via INTRAVENOUS
  Filled 2024-05-12: qty 20

## 2024-05-12 MED ORDER — SODIUM CHLORIDE 0.9 % IV BOLUS
1000.0000 mL | Freq: Once | INTRAVENOUS | Status: AC
  Administered 2024-05-12: 1000 mL via INTRAVENOUS

## 2024-05-12 MED ORDER — CEFPODOXIME PROXETIL 200 MG PO TABS: 200.0000 mg | ORAL_TABLET | Freq: Two times a day (BID) | ORAL | 0 refills | Status: AC

## 2024-05-12 NOTE — ED Provider Notes (Signed)
 [facility] AT [facility] Provider Note   CSN: [id number] Arrival date & time: 05/12/24  0100     Patient presents with: Abdominal Pain   Joan Sawyer is a 33 y.o. female.  Patient with noncontributory past medical history reports to emergency room with complaint of abdominal pain.  Patient reports approximately 2 days of left lower quadrant abdominal pain associated with urinary frequency and dysuria.  She has had chills and had documented fever earlier this morning of 100.8.  Fever did improve after receiving Tylenol .  She denies any associated nausea vomiting diarrhea.  She reports she is sexually active but denies any vaginal discharge or vaginal bleeding associated.    Abdominal Pain      Prior to Admission medications   Medication Sig Start Date End Date Taking? Authorizing Provider  cetirizine (ZYRTEC) 10 MG tablet Take 10 mg by mouth daily. 10/30/22   [provider]  ibuprofen  (ADVIL ,MOTRIN ) 600 MG tablet Take 1 tablet (600 mg total) by mouth every 6 (six) hours. 09/24/18   Peiffer, Lauraine LABOR, MD  Prenatal Vit-Fe Fumarate-FA (MULTIVITAMIN-PRENATAL) 27-0.8 MG TABS tablet Take 1 tablet by mouth daily at 12 noon. 03/16/18   Edmundo Moats D, CNM  senna-docusate (SENOKOT-S) 8.6-50 MG tablet Take 2 tablets by mouth daily. 09/25/18   Peiffer, Lauraine LABOR, MD  tenofovir  (VIREAD ) 300 MG tablet Take 1 tablet (300 mg total) by mouth daily. Try to take at the same times each day with or without food. 06/30/18   Dixon, Corean SAILOR, NP  VENTOLIN HFA 108 (90 Base) MCG/ACT inhaler Inhale 2 puffs into the lungs every 6 (six) hours as needed. 10/30/22   [provider]    Allergies: Patient has no known allergies.    Review of Systems  Gastrointestinal:  Positive for abdominal pain.    Updated Vital Signs BP 120/79 (BP Location: Left Arm)   Pulse (!) 103   Temp 99 F (37.2 C)   Resp 16   SpO2 96%   Physical Exam Vitals and nursing note reviewed.   Constitutional:      General: She is not in acute distress.    Appearance: She is not toxic-appearing.  HENT:     Head: Normocephalic and atraumatic.  Eyes:     General: No scleral icterus.    Conjunctiva/sclera: Conjunctivae normal.  Cardiovascular:     Rate and Rhythm: Normal rate and regular rhythm.     Pulses: Normal pulses.     Heart sounds: Normal heart sounds.  Pulmonary:     Effort: Pulmonary effort is normal. No respiratory distress.     Breath sounds: Normal breath sounds.  Abdominal:     General: Abdomen is flat. Bowel sounds are normal.     Palpations: Abdomen is soft.     Tenderness: There is abdominal tenderness in the left lower quadrant. There is left CVA tenderness.  Skin:    General: Skin is warm and dry.     Findings: No lesion.  Neurological:     General: No focal deficit present.     Mental Status: She is alert and oriented to person, place, and time. Mental status is at baseline.     (all labs ordered are listed, but only abnormal results are displayed) Labs Reviewed  COMPREHENSIVE METABOLIC PANEL WITH GFR - Abnormal; Notable for the following components:      Result Value   CO2 20 (*)    Glucose, Bld 118 (*)    Calcium  8.6 (*)    ALT 52 (*)    All other components within normal limits  URINALYSIS, ROUTINE W REFLEX MICROSCOPIC - Abnormal; Notable for the following components:   APPearance CLOUDY (*)    Hgb urine dipstick MODERATE (*)    Protein, ur 100 (*)    Leukocytes,Ua LARGE (*)    Bacteria, UA RARE (*)    All other components within normal limits  URINE CULTURE  LIPASE, BLOOD  CBC  HCG, SERUM, QUALITATIVE    EKG: None  Radiology: CT Renal Stone Study Result Date: 05/12/2024 CLINICAL DATA:  Abdominal pain, stone suspected EXAM: CT ABDOMEN AND PELVIS WITHOUT CONTRAST TECHNIQUE: Multidetector CT imaging of the abdomen and pelvis was performed following the standard protocol without IV contrast. RADIATION DOSE REDUCTION: This exam was  performed according to the departmental dose-optimization program which includes automated exposure control, adjustment of the mA and/or kV according to patient size and/or use of iterative reconstruction technique. COMPARISON:  None Available. FINDINGS: Lower chest: No acute abnormality. Hepatobiliary: No focal liver abnormality is seen. No gallstones, gallbladder wall thickening, or biliary dilatation. Pancreas: Unremarkable. No pancreatic ductal dilatation or surrounding inflammatory changes. Spleen: Normal in size without focal abnormality.  Small splenule Adrenals/Urinary Tract: Left kidney upper pole punctate nonobstructive nephrolithiasis versus vascular calcification measuring 2 mm. No hydronephrosis. Right kidney is unremarkable. Stomach/Bowel: Stomach is within normal limits. Appendix appears normal. No evidence of bowel wall thickening, distention, or inflammatory changes. Vascular/Lymphatic: No significant vascular findings are present. No enlarged abdominal or pelvic lymph nodes. Reproductive: Left adnexal/ovarian hypoattenuating lesion measuring 5.3 x 2.8 cm with Hounsfield unit of 31, may represent a left ovarian complex cyst. Uterus and right ovary are unremarkable. Other: No significant hernia. Musculoskeletal: No suspicious osseous lesion IMPRESSION: Left adnexal/ovarian lesion likely a complex cyst . Recommend dedicated pelvic with ultrasound for further assessment. Punctate 2 mm nonobstructive nephrolithiasis versus vascular calcification in left kidney upper pole . No hydronephrosis. Electronically Signed   By: Megan  Zare M.D.   On: 05/12/2024 12:52     Procedures   Medications Ordered in the ED  sodium chloride  0.9 % bolus 1,000 mL (has no administration in time range)  ondansetron  (ZOFRAN ) injection 4 mg (has no administration in time range)  acetaminophen  (TYLENOL ) tablet 650 mg (650 mg Oral Given 05/12/24 0819)    Clinical Course as of 05/12/24 1557  Wed May 12, 2024  1526  Spoke with Dr Cleatus, who recommend outpatient follow up. [JB]    Clinical Course User Index [JB] Dawsyn Ramsaran, Warren SAILOR, PA-C                                 Medical Decision Making Amount and/or Complexity of Data Reviewed Labs: ordered. Radiology: ordered.  Risk OTC drugs. Prescription drug management.   This patient presents to the ED for concern of abdominal pain, this involves an extensive number of treatment options, and is a complaint that carries with it a high risk of complications and morbidity.  The differential diagnosis includes pyelonephritis, kidney stone, urinary tract infection, PID, STD, pelvic abscess   Lab Tests:  I personally interpreted labs.  The pertinent results include:   CBC without leukocytosis.  No anemia.  Normal kidney and liver function.  Pregnancy test is negative.  UA is positive for hemoglobin, large leukocytes, white blood cells and bacteria.  I have sent urine for culture   Imaging Studies ordered:  I  ordered imaging studies including CT renal study with left sided CVA tenderness. I independently visualized and interpreted imaging which showed left ovarian mass likely complex cyst.  Recommending pelvic ultrasound. I agree with the radiologist interpretation Pelvic ultrasound ordered left sided 5 cm cyst.  No sign of ovarian torsion.   Cardiac Monitoring: / EKG:  The patient was maintained on a cardiac monitor.     Consultations Obtained:  I requested consultation with the OB/GYN Dr. Cleatus,  and discussed lab and imaging findings as well as pertinent plan - they recommend: Patient follow-up.   Problem List / ED Course / Critical interventions / Medication management  Patient reports to emergency room with 2 days of left lower quadrant abdominal pain.  She is hemodynamically stable and well-appearing.  Did have fever while in the emergency room which she was given Tylenol  for and improved fever.  She has no white count.  UA is consistent  with urinary tract infection and patient reports symptoms consistent with urinary tract infection.  She does have reproducible left lower quadrant abdominal pain on exam as well as left-sided CVA tenderness.  Will get CT scan to rule out pyelonephritis, infected kidney stone or other pathology. CT scan shows left ovarian cyst.  I will order a pelvic ultrasound to further reassess.  Given 1 g of Rocephin  for urinary tract infection.  Will p.o. challenge. Patient passed p.o. challenge.  She is feeling better.  Her vital signs are stable.  She will pick up her oral antibiotic.  She was given strict return precautions.  Feel she is stable for discharge with outpatient follow-up and outpatient management. Pelvic ultrasound shows no findings of ovarian torsion.  Symptoms are not consistent with ovarian torsion.  She does have a 5 cm ovarian cyst.  Will follow-up with OB/GYN for this. I ordered medication including normal saline, Zofran , Tylenol  Reevaluation of the patient after these medicines showed that the patient improved I have reviewed the patients home medicines and have made adjustments as needed       Final diagnoses:  Pyelonephritis  Cyst of left ovary    ED Discharge Orders          Ordered    cefpodoxime  (VANTIN ) 200 MG tablet  2 times daily,   Status:  Discontinued        05/12/24 1445    cefpodoxime  (VANTIN ) 200 MG tablet  2 times daily        05/12/24 1535               Michalla Ringer, Warren SAILOR, PA-C 05/12/24 1600    Doretha Folks, MD 05/14/24 2240

## 2024-05-12 NOTE — Discharge Instructions (Signed)
 Follow-up with OB/GYN for ovarian cyst.  Take antibiotic as prescribed.  Make sure you are staying well-hydrated with primarily water.  You can alternate Tylenol  as well.  Return to emergency room with new or worsening symptoms.

## 2024-05-12 NOTE — ED Triage Notes (Signed)
 C/o LLQ pain that started last night. Denies N/V/D

## 2024-05-14 LAB — URINE CULTURE: Culture: >=100000 — AB

## 2024-05-15 ENCOUNTER — Telehealth (HOSPITAL_BASED_OUTPATIENT_CLINIC_OR_DEPARTMENT_OTHER): Payer: Self-pay | Admitting: *Deleted

## 2024-05-15 NOTE — Telephone Encounter (Signed)
 Post ED Visit - Positive Culture Follow-up  Culture report reviewed by antimicrobial stewardship pharmacist: [facility] []  Rankin Dee, Pharm.D. []  Venetia Gully, Pharm.D., BCPS AQ-ID []  Garrel Crews, Pharm.D., BCPS []  Almarie Lunger, Pharm.D., BCPS []  Brent, [address].D., BCPS, AAHIVP []  Rosaline Bihari, Pharm.D., BCPS, AAHIVP []  Vernell Meier, PharmD, BCPS []  Latanya Hint, PharmD, BCPS []  Donald Medley, PharmD, BCPS []  Rocky Bold, PharmD []  Dorothyann Alert, PharmD, BCPS [x]  Dorn Buttner,  PharmD  [facility] []  Rosaline Edison, PharmD []  Romona Bliss, PharmD []  Dolphus Roller, PharmD []  Veva Seip, Rph []  Vernell Daunt) Leonce, PharmD []  Eva Allis, PharmD []  Rosaline Millet, PharmD []  Iantha Batch, PharmD []  Arvin Gauss, PharmD []  Wanda Hasting, PharmD []  Ronal Rav, PharmD []  Rocky Slade, PharmD []  Bard Jeans, PharmD   Positive urine culture Treated with Cefpodoxime  Proxetil, organism sensitive to the same and no further patient follow-up is required at this time.  Joan Sawyer 05/15/2024, 1:36 PM

## 2024-05-27 ENCOUNTER — Ambulatory Visit (INDEPENDENT_AMBULATORY_CARE_PROVIDER_SITE_OTHER): Admitting: Obstetrics and Gynecology

## 2024-05-27 ENCOUNTER — Encounter: Payer: Self-pay | Admitting: Obstetrics and Gynecology

## 2024-05-27 VITALS — BP 120/84 | HR 77 | Ht 65.0 in | Wt 194.0 lb

## 2024-05-27 DIAGNOSIS — N83202 Unspecified ovarian cyst, left side: Secondary | ICD-10-CM | POA: Diagnosis not present

## 2024-05-27 DIAGNOSIS — Z603 Acculturation difficulty: Secondary | ICD-10-CM | POA: Diagnosis not present

## 2024-05-27 DIAGNOSIS — Z758 Other problems related to medical facilities and other health care: Secondary | ICD-10-CM

## 2024-05-27 NOTE — Progress Notes (Signed)
   ESTABLISHED GYNECOLOGY VISIT Chief Complaint  Patient presents with   Ultrasound Results    Subjective:  Joan Sawyer is a 33 y.o. [id number] presenting for ER follow up for pelvic ultrasound.  Seen in ER on 7/30 for pain and was diagnosed with pyelonephritis and treated with antibiotics. She had incidental finding of left ovarian cyst. She reports her pain from the ER has resolved but feels occasional pain in her uterus.  ER note and pelvic ultrasound report & images reviewed  Pelvic ultrasound 05/12/24: IMPRESSION: 4.6 x 2.2 x 5.3 cm simple appearing cystic focus along the left ovary. No significant free fluid in the pelvis.   Recommend follow-up US  in 3-6 months. In addition please correlate for any symptoms of a torsion or de torsion phenomena. There is flow to the left ovary today on color Doppler at this time. Note: This recommendation does not apply to premenarchal patients or to those with increased risk (genetic, family history, elevated tumor markers or other high-risk factors) of ovarian cancer. Reference: Radiology 2019 Nov; 293(2):359-371.  Review of Systems:   Pertinent items are noted in HPI  Pertinent History Reviewed:  Reviewed past medical,surgical, social and family history.  Reviewed problem list, medications and allergies.  Objective:   Vitals:   05/27/24 1336  BP: 120/84  Pulse: 77  Weight: 194 lb (88 kg)  Height: 5' 5 (1.651 m)   Physical Examination:   General appearance - well appearing, and in no distress  Mental status - alert, oriented to person, place, and time  Psych:  normal mood and affect  Skin - warm and dry, normal color, no suspicious lesions noted  Abdomen - soft, nontender, nondistended, no masses or organomegaly    Assessment and Plan:  1. Left ovarian cyst (Primary) Reviewed ultrasound findings of benign appearing simple cyst. Reviewed ultrasound surveillance, occasional need for surgical intervention for pain, growing cyst,  ovarian torsion or other indications reviewed. Torsion precautions reviewed in detail. Recommend ultrasound in 3 months. All questions answered - US  PELVIC COMPLETE WITH TRANSVAGINAL; Future  2. Language barrier:  - Video interpreter Burmese Joan Sawyer [phone]  No follow-ups on file.  Future Appointments  Date Time Provider Department Center  06/01/2024  8:35 AM Zina Jerilynn LABOR, MD [facility] None    Rollo ONEIDA Bring, MD, FACOG Obstetrician & Gynecologist, [facility] for [facility], [facility]

## 2024-05-27 NOTE — Progress Notes (Signed)
 33 y.o. GYN presents for US  results.  Last PAP 11/08/22  NILM

## 2024-06-01 ENCOUNTER — Ambulatory Visit: Admitting: Obstetrics and Gynecology

## 2024-06-01 VITALS — BP 118/81 | HR 86 | Wt 197.0 lb

## 2024-06-01 DIAGNOSIS — Z3046 Encounter for surveillance of implantable subdermal contraceptive: Secondary | ICD-10-CM | POA: Diagnosis not present

## 2024-06-01 DIAGNOSIS — Z3009 Encounter for other general counseling and advice on contraception: Secondary | ICD-10-CM

## 2024-06-01 MED ORDER — NORETHIN ACE-ETH ESTRAD-FE 1-20 MG-MCG(24) PO TABS
1.0000 | ORAL_TABLET | Freq: Every day | ORAL | 3 refills | Status: AC
Start: 2024-06-01 — End: ?

## 2024-06-01 NOTE — Progress Notes (Signed)
     GYNECOLOGY OFFICE PROCEDURE NOTE  Joan Sawyer is a 33 y.o. [id number] here for Nexplanon  removal.  Last pap smear was on 1/24 and was normal.  No other gynecologic concerns.   Nexplanon  Removal Patient identified, informed consent performed, consent signed.   Appropriate time out taken. Nexplanon  site identified.  Area prepped in usual sterile fashon. One ml of 1% lidocaine  was used to anesthetize the area at the distal end of the implant. A small stab incision was made right beside the implant on the distal portion.  The Nexplanon  rod was grasped using hemostats and removed without difficulty.  There was minimal blood loss. There were no complications.  3 ml of 1% lidocaine  was injected around the incision for post-procedure analgesia.  Steri-strips were applied over the small incision.  A pressure bandage was applied to reduce any bruising.  The patient tolerated the procedure well and was given post procedure instructions.  Patient is planning to use OCP for contraception.    Jerilynn Buddle, MD, FACOG Obstetrician & Gynecologist, [facility] for [facility], [facility]

## 2024-06-01 NOTE — Progress Notes (Signed)
 Pt is in office for Nexplanon  removal. Pt would like to try a pill.

## 2024-08-17 ENCOUNTER — Ambulatory Visit (HOSPITAL_COMMUNITY)
Admission: RE | Admit: 2024-08-17 | Discharge: 2024-08-17 | Disposition: A | Discharge status: Home / Self Care | Source: Ambulatory Visit | Attending: Obstetrics and Gynecology | Admitting: Obstetrics and Gynecology

## 2024-08-17 DIAGNOSIS — N83202 Unspecified ovarian cyst, left side: Secondary | ICD-10-CM | POA: Insufficient documentation

## 2024-08-24 ENCOUNTER — Ambulatory Visit: Payer: Self-pay | Admitting: Obstetrics and Gynecology

## 2024-08-24 DIAGNOSIS — N83201 Unspecified ovarian cyst, right side: Secondary | ICD-10-CM

## 2024-10-11 ENCOUNTER — Ambulatory Visit (HOSPITAL_COMMUNITY)
Admission: RE | Admit: 2024-10-11 | Discharge: 2024-10-11 | Disposition: A | Discharge status: Home / Self Care | Source: Ambulatory Visit | Attending: Obstetrics and Gynecology | Admitting: Obstetrics and Gynecology

## 2024-10-11 DIAGNOSIS — N83201 Unspecified ovarian cyst, right side: Secondary | ICD-10-CM | POA: Insufficient documentation

## 2024-10-25 ENCOUNTER — Ambulatory Visit: Payer: Self-pay | Admitting: Obstetrics and Gynecology

## 2024-10-26 NOTE — Telephone Encounter (Signed)
-----   Message from Rollo Bring, MD sent at 10/25/2024  8:48 AM EST ----- Normal ultrasound. The previously seen ovarian cyst has resolved. Please review with patient. Burmese interpreter needed

## 2024-10-26 NOTE — Telephone Encounter (Signed)
 Spoke with patient using Burmese interpreter and correctly identified the patient using name and DOB. Patient given results and voiced understanding.
